# Patient Record
Sex: Male | Born: 1940 | Race: White | Hispanic: No | Smoking: Never smoker
Health system: Southern US, Community
[De-identification: ages and names within clinical notes are randomized; demographics above are authoritative.]

## PROBLEM LIST (undated history)

## (undated) DIAGNOSIS — Z8719 Personal history of other diseases of the digestive system: Secondary | ICD-10-CM

## (undated) DIAGNOSIS — Z8679 Personal history of other diseases of the circulatory system: Secondary | ICD-10-CM

## (undated) DIAGNOSIS — H269 Unspecified cataract: Secondary | ICD-10-CM

## (undated) DIAGNOSIS — M1990 Unspecified osteoarthritis, unspecified site: Secondary | ICD-10-CM

## (undated) DIAGNOSIS — K625 Hemorrhage of anus and rectum: Secondary | ICD-10-CM

## (undated) DIAGNOSIS — J309 Allergic rhinitis, unspecified: Secondary | ICD-10-CM

## (undated) DIAGNOSIS — K4090 Unilateral inguinal hernia, without obstruction or gangrene, not specified as recurrent: Secondary | ICD-10-CM

## (undated) DIAGNOSIS — K5730 Diverticulosis of large intestine without perforation or abscess without bleeding: Secondary | ICD-10-CM

## (undated) DIAGNOSIS — F32A Depression, unspecified: Secondary | ICD-10-CM

## (undated) DIAGNOSIS — K589 Irritable bowel syndrome without diarrhea: Secondary | ICD-10-CM

## (undated) DIAGNOSIS — F1010 Alcohol abuse, uncomplicated: Secondary | ICD-10-CM

## (undated) DIAGNOSIS — F411 Generalized anxiety disorder: Secondary | ICD-10-CM

## (undated) DIAGNOSIS — N400 Enlarged prostate without lower urinary tract symptoms: Secondary | ICD-10-CM

## (undated) DIAGNOSIS — I10 Essential (primary) hypertension: Secondary | ICD-10-CM

## (undated) DIAGNOSIS — D649 Anemia, unspecified: Secondary | ICD-10-CM

## (undated) DIAGNOSIS — K7460 Unspecified cirrhosis of liver: Secondary | ICD-10-CM

## (undated) DIAGNOSIS — Z87442 Personal history of urinary calculi: Secondary | ICD-10-CM

## (undated) DIAGNOSIS — K219 Gastro-esophageal reflux disease without esophagitis: Secondary | ICD-10-CM

## (undated) DIAGNOSIS — D696 Thrombocytopenia, unspecified: Secondary | ICD-10-CM

## (undated) DIAGNOSIS — F419 Anxiety disorder, unspecified: Secondary | ICD-10-CM

## (undated) DIAGNOSIS — D126 Benign neoplasm of colon, unspecified: Secondary | ICD-10-CM

## (undated) DIAGNOSIS — Z86010 Personal history of colonic polyps: Secondary | ICD-10-CM

## (undated) DIAGNOSIS — H3530 Unspecified macular degeneration: Secondary | ICD-10-CM

## (undated) DIAGNOSIS — R21 Rash and other nonspecific skin eruption: Secondary | ICD-10-CM

## (undated) DIAGNOSIS — F329 Major depressive disorder, single episode, unspecified: Secondary | ICD-10-CM

## (undated) DIAGNOSIS — R350 Frequency of micturition: Secondary | ICD-10-CM

## (undated) DIAGNOSIS — K648 Other hemorrhoids: Secondary | ICD-10-CM

## (undated) HISTORY — DX: Benign prostatic hyperplasia without lower urinary tract symptoms: N40.0

## (undated) HISTORY — DX: Alcohol abuse, uncomplicated: F10.10

## (undated) HISTORY — DX: Diverticulosis of large intestine without perforation or abscess without bleeding: K57.30

## (undated) HISTORY — DX: Frequency of micturition: R35.0

## (undated) HISTORY — PX: INGUINAL HERNIA REPAIR: SUR1180

## (undated) HISTORY — DX: Unspecified cirrhosis of liver: K74.60

## (undated) HISTORY — DX: Anxiety disorder, unspecified: F41.9

## (undated) HISTORY — DX: Personal history of urinary calculi: Z87.442

## (undated) HISTORY — DX: Irritable bowel syndrome without diarrhea: K58.9

## (undated) HISTORY — DX: Hemorrhage of anus and rectum: K62.5

## (undated) HISTORY — DX: Personal history of colonic polyps: Z86.010

## (undated) HISTORY — DX: Personal history of other diseases of the circulatory system: Z86.79

## (undated) HISTORY — PX: HERNIA REPAIR: SHX51

## (undated) HISTORY — DX: Personal history of other diseases of the digestive system: Z87.19

## (undated) HISTORY — DX: Other hemorrhoids: K64.8

## (undated) HISTORY — DX: Unspecified macular degeneration: H35.30

## (undated) HISTORY — DX: Unilateral inguinal hernia, without obstruction or gangrene, not specified as recurrent: K40.90

## (undated) HISTORY — DX: Gastro-esophageal reflux disease without esophagitis: K21.9

## (undated) HISTORY — DX: Anemia, unspecified: D64.9

## (undated) HISTORY — DX: Thrombocytopenia, unspecified: D69.6

## (undated) HISTORY — DX: Benign neoplasm of colon, unspecified: D12.6

## (undated) HISTORY — DX: Generalized anxiety disorder: F41.1

## (undated) HISTORY — DX: Major depressive disorder, single episode, unspecified: F32.9

## (undated) HISTORY — DX: Allergic rhinitis, unspecified: J30.9

## (undated) HISTORY — DX: Unspecified osteoarthritis, unspecified site: M19.90

## (undated) HISTORY — DX: Unspecified cataract: H26.9

## (undated) HISTORY — DX: Depression, unspecified: F32.A

## (undated) HISTORY — PX: CATARACT EXTRACTION: SUR2

## (undated) HISTORY — DX: Rash and other nonspecific skin eruption: R21

---

## 2001-02-28 ENCOUNTER — Encounter (INDEPENDENT_AMBULATORY_CARE_PROVIDER_SITE_OTHER): Payer: Self-pay | Admitting: Specialist

## 2001-02-28 ENCOUNTER — Other Ambulatory Visit: Admission: RE | Admit: 2001-02-28 | Discharge: 2001-02-28 | Payer: Self-pay | Admitting: Internal Medicine

## 2001-04-16 ENCOUNTER — Ambulatory Visit (HOSPITAL_COMMUNITY): Admission: RE | Admit: 2001-04-16 | Discharge: 2001-04-16 | Payer: Self-pay | Admitting: Internal Medicine

## 2001-04-16 ENCOUNTER — Encounter: Payer: Self-pay | Admitting: Internal Medicine

## 2002-01-21 ENCOUNTER — Inpatient Hospital Stay (HOSPITAL_COMMUNITY): Admission: EM | Admit: 2002-01-21 | Discharge: 2002-01-26 | Payer: Self-pay

## 2002-01-23 ENCOUNTER — Encounter: Payer: Self-pay | Admitting: Cardiology

## 2002-12-17 ENCOUNTER — Ambulatory Visit (HOSPITAL_COMMUNITY): Admission: RE | Admit: 2002-12-17 | Discharge: 2002-12-17 | Payer: Self-pay | Admitting: Orthopedic Surgery

## 2002-12-17 ENCOUNTER — Encounter: Payer: Self-pay | Admitting: Orthopedic Surgery

## 2005-08-01 ENCOUNTER — Emergency Department (HOSPITAL_COMMUNITY): Admission: EM | Admit: 2005-08-01 | Discharge: 2005-08-02 | Payer: Self-pay | Admitting: Emergency Medicine

## 2005-08-02 ENCOUNTER — Inpatient Hospital Stay (HOSPITAL_COMMUNITY): Admission: EM | Admit: 2005-08-02 | Discharge: 2005-08-05 | Payer: Self-pay | Admitting: Psychiatry

## 2005-08-02 ENCOUNTER — Ambulatory Visit: Payer: Self-pay | Admitting: Psychiatry

## 2005-09-27 ENCOUNTER — Ambulatory Visit: Payer: Self-pay | Admitting: Internal Medicine

## 2005-09-27 LAB — CONVERTED CEMR LAB
PSA: 1.51 ng/mL
PSA: 1.51 ng/mL

## 2005-10-06 ENCOUNTER — Ambulatory Visit: Payer: Self-pay

## 2005-10-11 ENCOUNTER — Ambulatory Visit: Payer: Self-pay | Admitting: Internal Medicine

## 2005-11-09 ENCOUNTER — Ambulatory Visit: Payer: Self-pay | Admitting: Cardiology

## 2005-11-15 ENCOUNTER — Ambulatory Visit: Payer: Self-pay | Admitting: Internal Medicine

## 2005-11-15 ENCOUNTER — Encounter (INDEPENDENT_AMBULATORY_CARE_PROVIDER_SITE_OTHER): Payer: Self-pay | Admitting: Specialist

## 2006-04-06 ENCOUNTER — Ambulatory Visit: Payer: Self-pay | Admitting: Internal Medicine

## 2006-04-12 ENCOUNTER — Encounter: Admission: RE | Admit: 2006-04-12 | Discharge: 2006-04-12 | Payer: Self-pay | Admitting: Otolaryngology

## 2006-06-28 ENCOUNTER — Ambulatory Visit: Payer: Self-pay | Admitting: Cardiology

## 2006-07-12 ENCOUNTER — Ambulatory Visit: Payer: Self-pay | Admitting: Internal Medicine

## 2007-03-31 ENCOUNTER — Encounter: Payer: Self-pay | Admitting: Internal Medicine

## 2007-03-31 DIAGNOSIS — Z87442 Personal history of urinary calculi: Secondary | ICD-10-CM

## 2007-03-31 DIAGNOSIS — K4090 Unilateral inguinal hernia, without obstruction or gangrene, not specified as recurrent: Secondary | ICD-10-CM

## 2007-03-31 DIAGNOSIS — F1010 Alcohol abuse, uncomplicated: Secondary | ICD-10-CM

## 2007-03-31 DIAGNOSIS — Z8679 Personal history of other diseases of the circulatory system: Secondary | ICD-10-CM

## 2007-03-31 DIAGNOSIS — F3289 Other specified depressive episodes: Secondary | ICD-10-CM

## 2007-03-31 DIAGNOSIS — F329 Major depressive disorder, single episode, unspecified: Secondary | ICD-10-CM

## 2007-03-31 DIAGNOSIS — I10 Essential (primary) hypertension: Secondary | ICD-10-CM | POA: Insufficient documentation

## 2007-03-31 DIAGNOSIS — F411 Generalized anxiety disorder: Secondary | ICD-10-CM

## 2007-03-31 HISTORY — DX: Unilateral inguinal hernia, without obstruction or gangrene, not specified as recurrent: K40.90

## 2007-03-31 HISTORY — DX: Major depressive disorder, single episode, unspecified: F32.9

## 2007-03-31 HISTORY — DX: Other specified depressive episodes: F32.89

## 2007-03-31 HISTORY — DX: Personal history of other diseases of the circulatory system: Z86.79

## 2007-03-31 HISTORY — DX: Personal history of urinary calculi: Z87.442

## 2007-03-31 HISTORY — DX: Generalized anxiety disorder: F41.1

## 2007-03-31 HISTORY — DX: Alcohol abuse, uncomplicated: F10.10

## 2007-04-04 ENCOUNTER — Ambulatory Visit: Payer: Self-pay | Admitting: Internal Medicine

## 2007-04-04 LAB — CONVERTED CEMR LAB
AST: 25 units/L (ref 0–37)
Albumin: 4 g/dL (ref 3.5–5.2)
Amylase: 100 units/L (ref 27–131)
Basophils Relative: 1.4 % — ABNORMAL HIGH (ref 0.0–1.0)
Bilirubin, Direct: 0.1 mg/dL (ref 0.0–0.3)
CO2: 34 meq/L — ABNORMAL HIGH (ref 19–32)
Chloride: 102 meq/L (ref 96–112)
Creatinine, Ser: 1 mg/dL (ref 0.4–1.5)
Eosinophils Relative: 5.9 % — ABNORMAL HIGH (ref 0.0–5.0)
Glucose, Bld: 111 mg/dL — ABNORMAL HIGH (ref 70–99)
Lymphocytes Relative: 21.8 % (ref 12.0–46.0)
MCHC: 34.5 g/dL (ref 30.0–36.0)
MCV: 91.1 fL (ref 78.0–100.0)
Monocytes Absolute: 0.4 10*3/uL (ref 0.2–0.7)
Monocytes Relative: 8.9 % (ref 3.0–11.0)
Neutrophils Relative %: 62 % (ref 43.0–77.0)
Potassium: 3.7 meq/L (ref 3.5–5.1)
RBC: 4.85 M/uL (ref 4.22–5.81)
RDW: 13.1 % (ref 11.5–14.6)
Sodium: 142 meq/L (ref 135–145)
Total Bilirubin: 0.9 mg/dL (ref 0.3–1.2)
Total Protein: 6.9 g/dL (ref 6.0–8.3)

## 2007-04-06 ENCOUNTER — Encounter: Payer: Self-pay | Admitting: Internal Medicine

## 2007-04-06 DIAGNOSIS — N400 Enlarged prostate without lower urinary tract symptoms: Secondary | ICD-10-CM

## 2007-04-06 DIAGNOSIS — D696 Thrombocytopenia, unspecified: Secondary | ICD-10-CM

## 2007-04-06 DIAGNOSIS — Z8719 Personal history of other diseases of the digestive system: Secondary | ICD-10-CM

## 2007-04-06 DIAGNOSIS — J309 Allergic rhinitis, unspecified: Secondary | ICD-10-CM

## 2007-04-06 DIAGNOSIS — K5730 Diverticulosis of large intestine without perforation or abscess without bleeding: Secondary | ICD-10-CM

## 2007-04-06 DIAGNOSIS — M1990 Unspecified osteoarthritis, unspecified site: Secondary | ICD-10-CM

## 2007-04-06 HISTORY — DX: Thrombocytopenia, unspecified: D69.6

## 2007-04-06 HISTORY — DX: Benign prostatic hyperplasia without lower urinary tract symptoms: N40.0

## 2007-04-06 HISTORY — DX: Personal history of other diseases of the digestive system: Z87.19

## 2007-04-06 HISTORY — DX: Unspecified osteoarthritis, unspecified site: M19.90

## 2007-04-06 HISTORY — DX: Diverticulosis of large intestine without perforation or abscess without bleeding: K57.30

## 2007-04-06 HISTORY — DX: Allergic rhinitis, unspecified: J30.9

## 2007-04-11 ENCOUNTER — Encounter: Admission: RE | Admit: 2007-04-11 | Discharge: 2007-04-11 | Payer: Self-pay | Admitting: Internal Medicine

## 2007-06-07 ENCOUNTER — Ambulatory Visit (HOSPITAL_COMMUNITY): Admission: RE | Admit: 2007-06-07 | Discharge: 2007-06-07 | Payer: Self-pay | Admitting: Sports Medicine

## 2007-06-25 ENCOUNTER — Ambulatory Visit: Payer: Self-pay | Admitting: Internal Medicine

## 2007-06-26 ENCOUNTER — Ambulatory Visit: Payer: Self-pay | Admitting: Internal Medicine

## 2007-06-26 ENCOUNTER — Encounter: Payer: Self-pay | Admitting: Internal Medicine

## 2007-08-02 DIAGNOSIS — K625 Hemorrhage of anus and rectum: Secondary | ICD-10-CM

## 2007-08-02 DIAGNOSIS — D126 Benign neoplasm of colon, unspecified: Secondary | ICD-10-CM

## 2007-08-02 DIAGNOSIS — K7460 Unspecified cirrhosis of liver: Secondary | ICD-10-CM

## 2007-08-02 DIAGNOSIS — K648 Other hemorrhoids: Secondary | ICD-10-CM

## 2007-08-02 HISTORY — DX: Unspecified cirrhosis of liver: K74.60

## 2007-08-02 HISTORY — DX: Other hemorrhoids: K64.8

## 2007-08-02 HISTORY — DX: Benign neoplasm of colon, unspecified: D12.6

## 2007-08-02 HISTORY — DX: Hemorrhage of anus and rectum: K62.5

## 2007-08-07 ENCOUNTER — Ambulatory Visit: Payer: Self-pay | Admitting: Internal Medicine

## 2007-08-07 DIAGNOSIS — R350 Frequency of micturition: Secondary | ICD-10-CM

## 2007-08-07 DIAGNOSIS — K219 Gastro-esophageal reflux disease without esophagitis: Secondary | ICD-10-CM

## 2007-08-07 DIAGNOSIS — Z860100 Personal history of colon polyps, unspecified: Secondary | ICD-10-CM

## 2007-08-07 DIAGNOSIS — D649 Anemia, unspecified: Secondary | ICD-10-CM

## 2007-08-07 DIAGNOSIS — K589 Irritable bowel syndrome without diarrhea: Secondary | ICD-10-CM

## 2007-08-07 DIAGNOSIS — R21 Rash and other nonspecific skin eruption: Secondary | ICD-10-CM

## 2007-08-07 HISTORY — DX: Frequency of micturition: R35.0

## 2007-08-07 HISTORY — DX: Rash and other nonspecific skin eruption: R21

## 2007-08-07 HISTORY — DX: Irritable bowel syndrome, unspecified: K58.9

## 2007-08-07 HISTORY — DX: Personal history of colon polyps, unspecified: Z86.0100

## 2007-08-07 HISTORY — DX: Anemia, unspecified: D64.9

## 2007-08-07 HISTORY — DX: Personal history of colonic polyps: Z86.010

## 2007-08-07 HISTORY — DX: Gastro-esophageal reflux disease without esophagitis: K21.9

## 2007-08-10 ENCOUNTER — Encounter: Payer: Self-pay | Admitting: Internal Medicine

## 2007-08-10 LAB — CONVERTED CEMR LAB
Bacteria, UA: NEGATIVE
Crystals: NEGATIVE
Hemoglobin, Urine: NEGATIVE
Specific Gravity, Urine: 1.01 (ref 1.000–1.03)
Squamous Epithelial / LPF: NEGATIVE /lpf
Total Protein, Urine: NEGATIVE mg/dL
pH: 6 (ref 5.0–8.0)

## 2007-08-17 ENCOUNTER — Ambulatory Visit: Payer: Self-pay | Admitting: Cardiology

## 2007-08-22 ENCOUNTER — Ambulatory Visit: Payer: Self-pay | Admitting: Internal Medicine

## 2007-11-13 ENCOUNTER — Encounter: Payer: Self-pay | Admitting: Internal Medicine

## 2007-12-20 ENCOUNTER — Ambulatory Visit (HOSPITAL_COMMUNITY): Admission: RE | Admit: 2007-12-20 | Discharge: 2007-12-20 | Payer: Self-pay | Admitting: Internal Medicine

## 2008-06-26 ENCOUNTER — Ambulatory Visit (HOSPITAL_COMMUNITY): Admission: RE | Admit: 2008-06-26 | Discharge: 2008-06-26 | Payer: Self-pay | Admitting: Internal Medicine

## 2008-07-01 IMAGING — US US ABDOMEN COMPLETE
1 series · 14 of 25 positions shown · non-contrast
Comparison: none

CLINICAL DATA: Right upper quadrant pain. 
 COMPLETE ABDOMINAL ULTRASOUND:
TECHNIQUE: Complete abdominal ultrasound examination was performed including evaluation of the liver, gallbladder, bile ducts, pancreas, kidneys, spleen, IVC, and abdominal aorta.

[Series 1: us abdomen complete · 0.32mm/px · 14 of 55 slices shown]
[im 1/55]
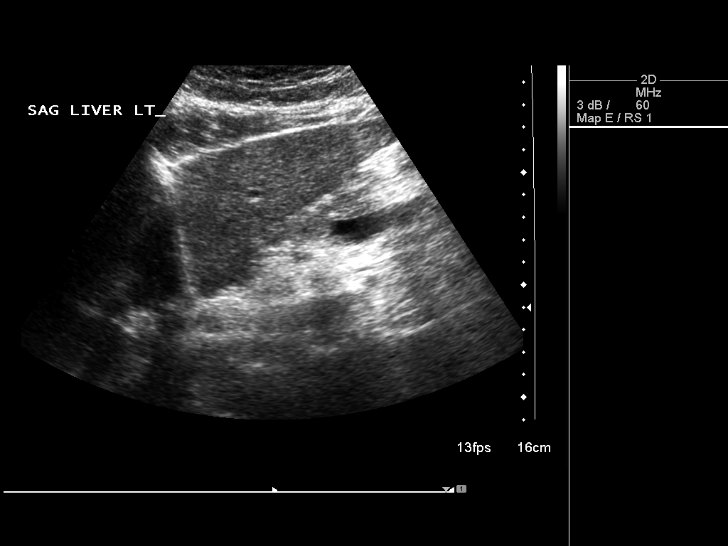
[im 5/55]
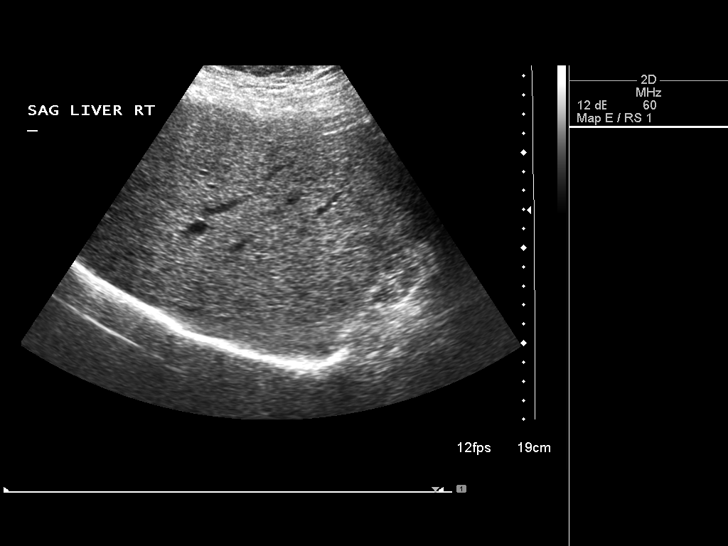
[im 10/55]
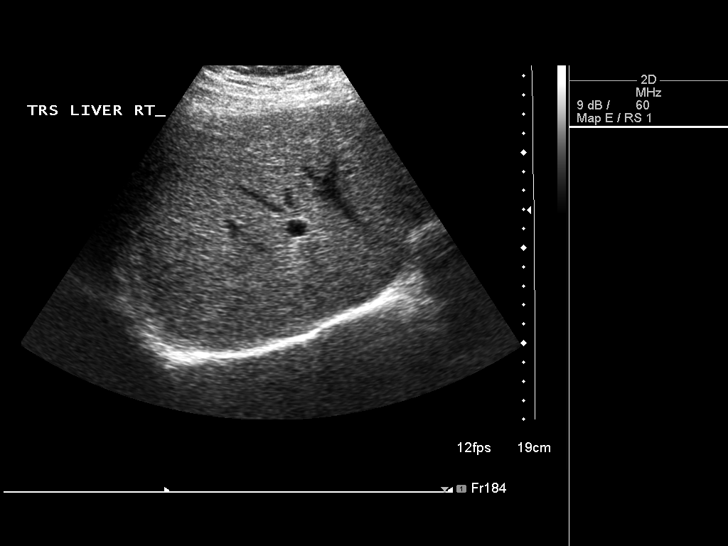
[im 14/55]
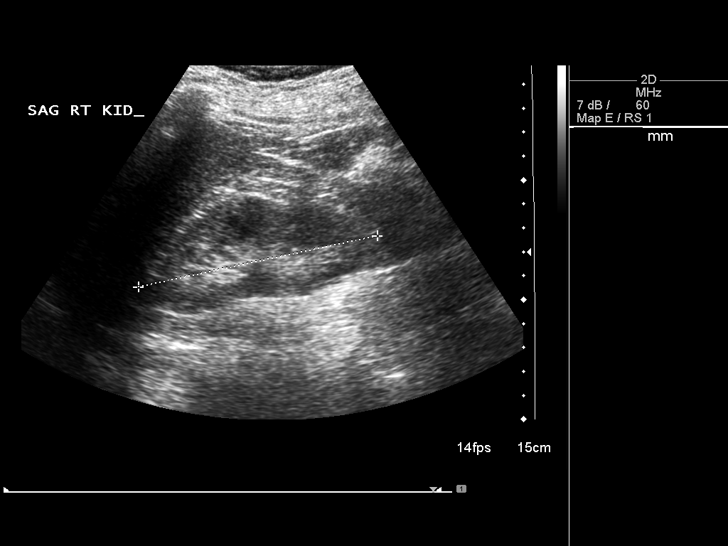
[im 19/55]
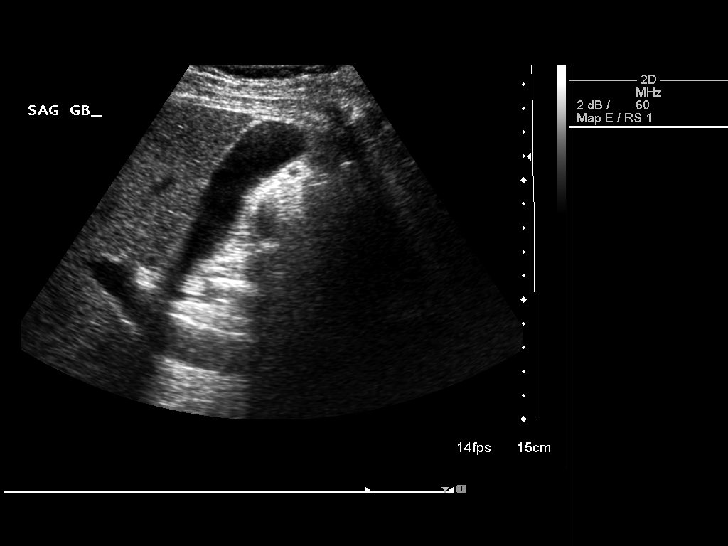
[im 21/55]
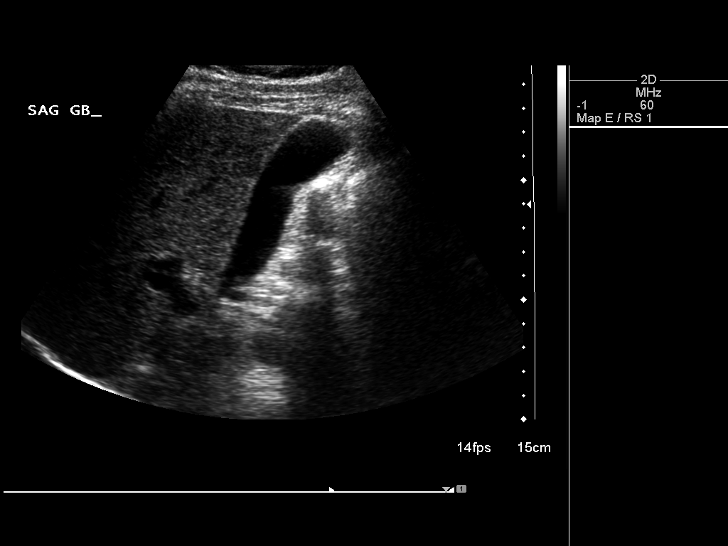
[im 25/55]
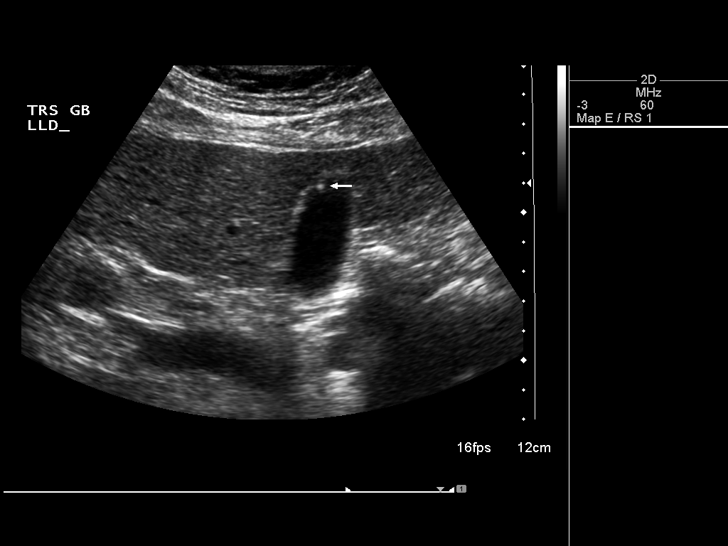
[im 30/55]
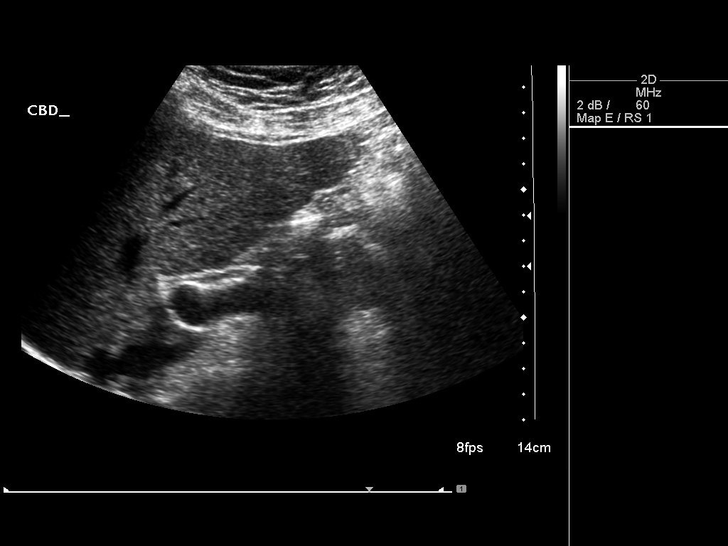
[im 34/55]
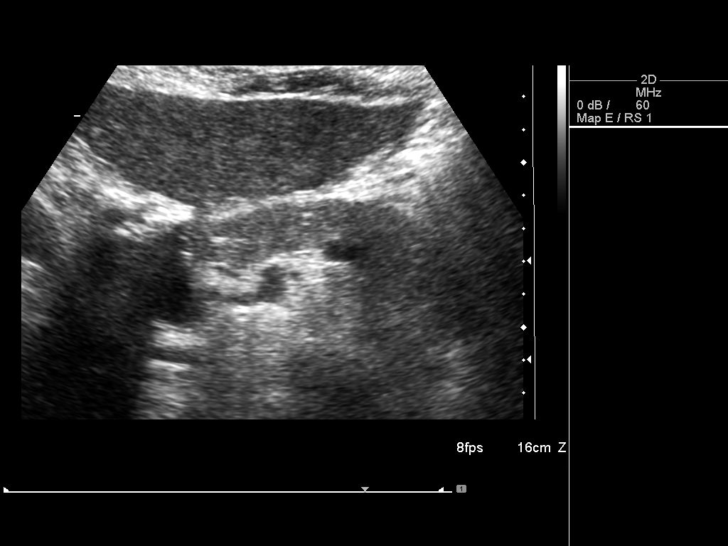
[im 37/55]
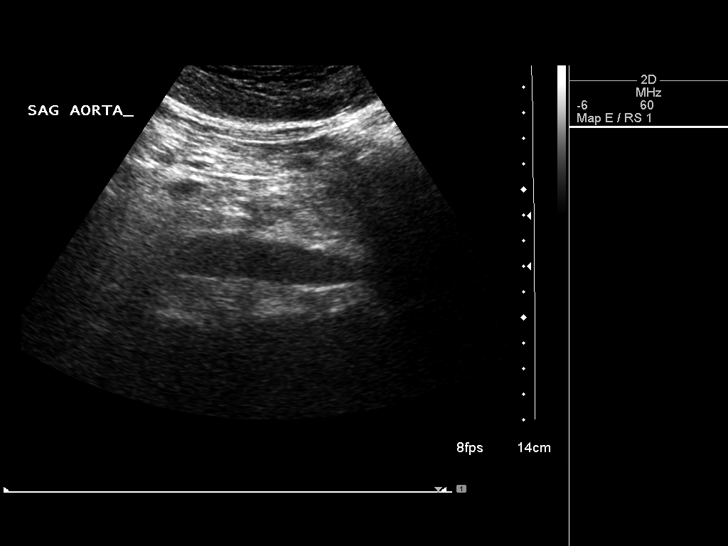
[im 41/55]
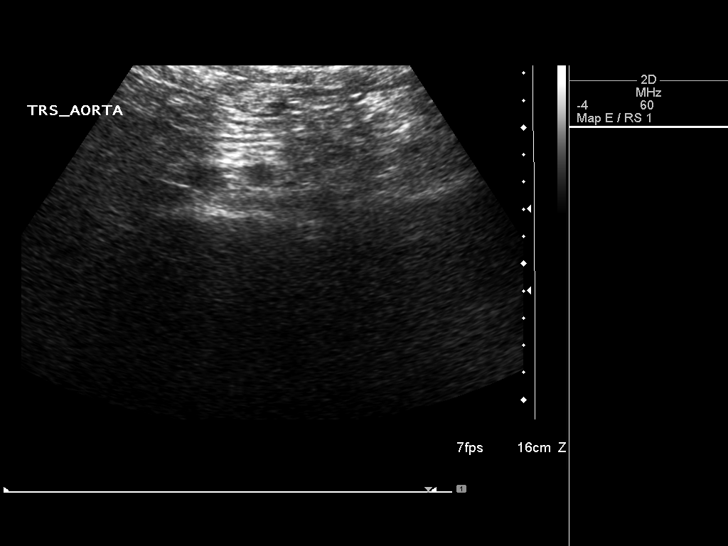
[im 46/55]
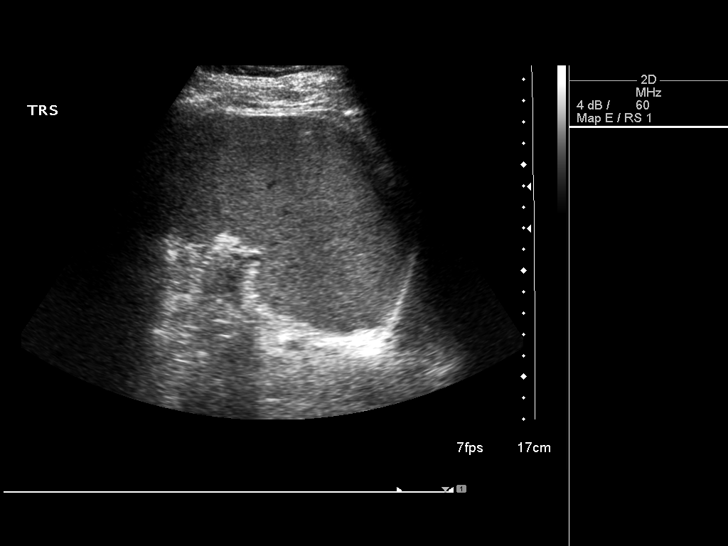
[im 50/55]
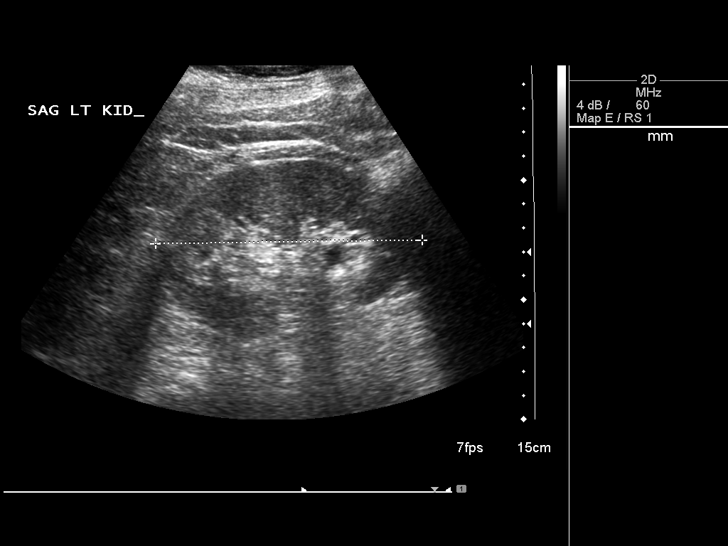
[im 55/55]
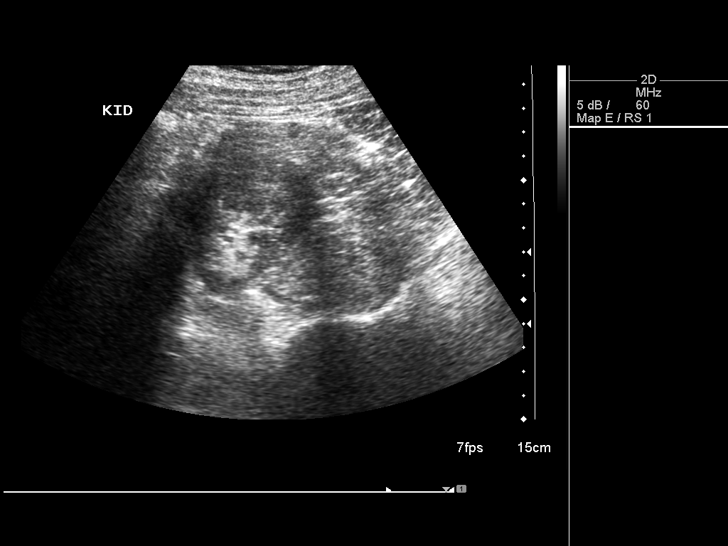

[14 of 25 positions shown; findings below may reference images not displayed]

FINDINGS: No gallstones are seen.  A small gallbladder polyp is noted of 4mm.  The liver has a normal echogenic pattern.  The common bile duct is normal measuring 6mm.  The IVC, pancreas, and spleen appear normal.  No hydronephrosis is seen.  The right kidney measures 10.2cm sagittally with the left kidney measuring 11.2cm.  The abdominal aorta is normal in caliber.
IMPRESSION: No gallstones.  4mm gallbladder polyp.  No ductal dilatation.

## 2008-10-15 ENCOUNTER — Encounter: Payer: Self-pay | Admitting: Cardiology

## 2008-10-15 ENCOUNTER — Ambulatory Visit: Payer: Self-pay | Admitting: Cardiology

## 2008-10-19 ENCOUNTER — Emergency Department (HOSPITAL_COMMUNITY): Admission: EM | Admit: 2008-10-19 | Discharge: 2008-10-19 | Payer: Self-pay | Admitting: Emergency Medicine

## 2010-01-18 ENCOUNTER — Encounter: Payer: Self-pay | Admitting: Cardiology

## 2010-01-19 ENCOUNTER — Ambulatory Visit: Payer: Self-pay | Admitting: Cardiology

## 2010-08-17 NOTE — Miscellaneous (Signed)
  Clinical Lists Changes  Observations: Added new observation of PAST MED HX: Anxiety Depression Hypertension Chronic Alcoholism Allergic rhinitis Diverticulosis, colon Osteoarthritis Benign prostatic hypertrophy Thrombocytopenia Hx of Alcoholic Hepatitis GERD Atrial fibrillation...paroxysmal...rare Colonic polyps, hx of Nephrolithiasis, hx of chronic sinusitis with nasal polyp Anemia-NOS thrombocytopenia secondary to ETOH hx IBS Liver disease....alcohol...Marland Kitchenno varices EF  55%...echo...2003 Nuclear scan in past...Marland KitchenMarland Kitchen?  slight inferior ischemia....no cath (01/18/2010 13:47) Added new observation of PRIMARY MD: Carylon Perches, MD (01/18/2010 13:47)       Past History:  Past Medical History: Anxiety Depression Hypertension Chronic Alcoholism Allergic rhinitis Diverticulosis, colon Osteoarthritis Benign prostatic hypertrophy Thrombocytopenia Hx of Alcoholic Hepatitis GERD Atrial fibrillation...paroxysmal...rare Colonic polyps, hx of Nephrolithiasis, hx of chronic sinusitis with nasal polyp Anemia-NOS thrombocytopenia secondary to ETOH hx IBS Liver disease....alcohol...Marland Kitchenno varices EF  55%...echo...2003 Nuclear scan in past...Marland KitchenMarland Kitchen?  slight inferior ischemia....no cath

## 2010-08-17 NOTE — Assessment & Plan Note (Signed)
Summary: f1y/ gd   Visit Type:  Follow-up Primary Provider:  Carylon Perches, MD  CC:  palpitations.  History of Present Illness: The patient is seen for followup history of palpitations.  He is actually done quite well.  He had atrial fibrillation in the remote past.  He is having very few symptoms.  I doubt any significant arrhythmias.  He does not have any significant chest pain.  Current Medications (verified): 1)  Flomax 0.4 Mg Cp24 (Tamsulosin Hcl) .... Take 1 Tablet By Mouth Once A Day 2)  Lexapro 20 Mg Tabs (Escitalopram Oxalate) .... Take 1 Tablet By Mouth Once A Day 3)  Protonix 40 Mg Tbec (Pantoprazole Sodium) .... Take 1 Tablet By Mouth Once A Day 4)  Zolpidem Tartrate 10 Mg Tabs (Zolpidem Tartrate) .... Take 1 Tablet By Mouth At Bedtime Prn 5)  Micardis Hct 40-12.5 Mg  Tabs (Telmisartan-Hctz) .Marland Kitchen.. 1 By Mouth Qd 6)  Aspirin 81 Mg  Tabs (Aspirin) .Marland Kitchen.. 1 Once Daily 7)  Milk Thistle 175 Mg Caps (Milk Thistle) .... 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  slight inferior ischemia....no cath  Review of Systems       Patient denies fever, chills, headache, sweats, rash, change in vision, change in hearing, chest pain, cough, nausea vomiting, urinary symptoms.  All of the systems are reviewed and are negative.  Vital Signs:  Patient profile:   70 year old male Height:      68 inches Weight:      167 pounds BMI:     25.48 Pulse rate:   62 /  minute BP sitting:   142 / 68  (left arm) Cuff size:   regular  Vitals Entered By: Hardin Negus, RMA (January 19, 2010 9:03 AM)  Physical Exam  General:  patient is quite stable. Eyes:  no xanthelasma. Neck:  no jugular venous distention. Lungs:  lungs are clear.  Respiratory effort is not labored. Heart:  cardiac exam reveals S1-S2.  No clicks or significant murmurs. Abdomen:  abdomen is soft. Extremities:  no peripheral edema. Psych:  patient is oriented to person time or place.  Affect is normal.   Impression & Recommendations:  Problem # 1:  ATRIAL FIBRILLATION (ICD-427.31)  His updated medication list for this problem includes:    Aspirin 81 Mg Tabs (Aspirin) .Marland Kitchen... 1 once daily  Orders: EKG w/ Interpretation (93000) There has been no return of significant atrial fibrillation.  EKG is done today and reviewed by me.  There is normal sinus rhythm.  Problem # 2:  HYPERTENSION (ICD-401.9)  His updated medication list for this problem includes:    Micardis Hct 40-12.5 Mg Tabs (Telmisartan-hctz) .Marland Kitchen... 1 by mouth qd    Aspirin 81 Mg Tabs (Aspirin) .Marland Kitchen... 1 once daily Blood pressure is controlled today.  No change in therapy.  Patient Instructions: 1)  Your physician wants you to follow-up in:  2 years.  You will receive a reminder letter in the mail two months in advance. If you don't receive a letter, please call our office to schedule the follow-up appointment.

## 2010-10-27 LAB — LIPASE, BLOOD: Lipase: 20 U/L (ref 11–59)

## 2010-10-27 LAB — URINALYSIS, ROUTINE W REFLEX MICROSCOPIC
Glucose, UA: NEGATIVE mg/dL
Ketones, ur: 40 mg/dL — AB
Nitrite: NEGATIVE
Protein, ur: NEGATIVE mg/dL

## 2010-10-27 LAB — COMPREHENSIVE METABOLIC PANEL
ALT: 44 U/L (ref 0–53)
Alkaline Phosphatase: 63 U/L (ref 39–117)
CO2: 15 mEq/L — ABNORMAL LOW (ref 19–32)
GFR calc non Af Amer: >60 mL/min (ref >60)
Glucose, Bld: 167 mg/dL — ABNORMAL HIGH (ref 70–99)
Potassium: 3.7 mEq/L (ref 3.5–5.1)
Sodium: 142 mEq/L (ref 135–145)
Total Bilirubin: 1.2 mg/dL (ref 0.3–1.2)

## 2010-10-27 LAB — DIFFERENTIAL
Basophils Relative: 0 % (ref 0–1)
Eosinophils Absolute: 0.1 10*3/uL (ref 0.0–0.7)
Neutrophils Relative %: 94 % — ABNORMAL HIGH (ref 43–77)

## 2010-10-27 LAB — BASIC METABOLIC PANEL
BUN: 13 mg/dL (ref 6–23)
Chloride: 110 mEq/L (ref 96–112)
Creatinine, Ser: 0.97 mg/dL (ref 0.4–1.5)

## 2010-10-27 LAB — CBC
Hemoglobin: 17.5 g/dL — ABNORMAL HIGH (ref 13.0–17.0)
RBC: 5.26 MIL/uL (ref 4.22–5.81)

## 2010-11-30 NOTE — Assessment & Plan Note (Signed)
[facility]                            CARDIOLOGY OFFICE NOTE   NAME:Gamino, PANCHO RUSHING                         MRN:          [id number]  DATE:10/15/2008                            DOB:          October 14, 1940    Mr. Bene is seen for followup.  He is not having any significant chest  pain.  There was question that he might need surgery on his ankle and he  needed cardiac clearance for this.  Since that time, his ankle has  improved.  Also since that time, his CT scan shows that he does have  some cirrhosis.   PAST MEDICAL HISTORY:   ALLERGIES:  No known drug allergies.   MEDICATIONS:  Flomax, Lexapro, Protonix, Zolpidem, Micardis, and  aspirin.   OTHER MEDICAL PROBLEMS:  See the complete list on my note of August 17, 2007.   REVIEW OF SYSTEMS:  He has no GI or GU symptoms.  He does have mild  abdominal pain at times when he gets upset.  He has no fevers or chills.  There are no skin rashes.  Otherwise, his review of systems is negative.   PHYSICAL EXAMINATION:  VITAL SIGNS:  Blood pressure is 152/82 with a  pulse of 64.  GENERAL:  The patient is oriented to person, time, and place.  Affect is  normal.  HEENT:  No xanthelasma.  He has normal extraocular motion.  NECK:  There are no carotid bruits.  There are no jugular venous  distention.  LUNGS:  Clear.  Respiratory effort is not labored.  CARDIAC:  An S1 with an S2.  There are no clicks or significant murmurs.  EXTREMITIES:  He has no peripheral edema.   EKG today is normal.   Problems are listed on my prior note.  He has a history of a  questionable abnormality on a Myoview scan in the past.  We do not know  if he has coronary disease.  However, he is stable at this point and no  further workup is needed.  I will see him back in 1 year or if needed.     Luis Abed, MD, [facility]  Electronically Signed    JDK/MedQ  DD: 10/15/2008  DT: 10/16/2008  Job #: [id number]   cc:   Kingsley Callander. Ouida Sills, MD

## 2010-11-30 NOTE — Assessment & Plan Note (Signed)
[facility]                         GASTROENTEROLOGY OFFICE NOTE   NAME:Calarco, ELAN BRAINERD                         MRN:          [id number]  DATE:06/25/2007                            DOB:          04-21-1941    OFFICE CONSULTATION:   REASON FOR CONSULTATION:  Abdominal pain.   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  He was last seen in the  office October 11, 2005, for rectal bleeding and surveillance colonoscopy.  Adenomatous colon polyps were initially diagnosed in 2002.  Surveillance  colonoscopy in May 2007 was remarkable for a diminutive polyp only.  Also diverticulosis.  Follow-up in 5 years recommended.  The patient is  referred today regarding two types of abdominal discomfort.  First, he  reports having left upper quadrant discomfort which he describes as  spasm.  He has had this for 20-25 years.  It has actually been somewhat  better over the past 2 years since he has not been using alcohol.  He  does note the discomfort is occasionally worse with meals and  occasionally worse with tension.  Discomfort can occur once or twice per  week and generally lasts for seconds.  It does not last for minutes.  He  also feels that discomfort can be improved with passing of flatus.  He  generally has 3-5 bowel movements per day, which he has had for years.  This is normal for him.  No bleeding.  His weight has gone up a bit over  the past few years.  He was placed on a proton pump inhibitor, which he  does not feel helps these particular symptoms.  He has a remote history  of reflux-type symptoms, which he has not had for years.  No dysphagia.  The second type of discomfort he describes is under the rib cage on the  right.  He notices this when he is lying on his  left side.  However, if  he moves a certain way the pains resolve.   PAST MEDICAL HISTORY:  As above.   ALLERGIES:  No known drug allergies.   CURRENT MEDICATIONS:  1. Diovan/HCT 60/25 mg daily.  2. Lexapro 10 mg daily.  3. Aspirin 81 mg daily.  4. Multivitamin.  5. Campra 333 mg t.i.d.  6. Flomax 0.4 mg daily.  7. Ambien p.r.n.  8. Protonix 40 mg daily.   PHYSICAL EXAM:  A well-appearing male in no acute distress.  He is alert  and oriented.  Blood pressure is 122/80, heart rate 66 and regular, weight is 179.8  pounds.  HEENT:  Sclerae are anicteric.  Conjunctivae are pink.  Oral mucosa is  intact.  No adenopathy.  LUNGS:  Clear.  HEART:  Regular.  ABDOMEN:  Soft without tenderness, mass or hernia.  Good bowel sounds  heard.  EXTREMITIES:  Without edema.   IMPRESSION:  1. Chronic left upper quadrant pain.  This certainly sounds like spasm  or trapped gas in the region of the splenic flexure.  No alarm      features.  2. Right upper quadrant pain, most certainly musculoskeletal.  3. History of alcoholic liver disease.  4. History of adenomatous colon polyps.  5. General medical problems as outlined.   RECOMMENDATIONS:  1. Upper endoscopy to evaluate abdominal pain as well as screen for      esophageal varies.  The nature of the procedure as well as the      risks, benefits, and alternatives have been reviewed.  He      understood and agreed to proceed.  2. Continue abstinence from alcohol.  3. Consider antispasmodic if pain is troublesome to the patient.  4. Ongoing general medical care with Dr. Jonny Ruiz.     Wilhemina Bonito. Marina Goodell, MD  Electronically Signed    JNP/MedQ  DD: 06/25/2007  DT: 06/25/2007  Job #: [id number]   cc:   Corwin Levins, MD

## 2010-11-30 NOTE — Assessment & Plan Note (Signed)
[facility]                            CARDIOLOGY OFFICE NOTE   NAME:Shane Ochoa                         MRN:          [id number]  DATE:08/17/2007                            DOB:          06/29/1941    [name] is seen for cardiology follow-up.  His cardiac status is  stable.  An exercise test in the past has questioned mild inferior  ischemia.  We watched this clinically and he has been stable.  He goes  about full activities and does not have significant symptoms.  He  continues to have some discomfort in his abdomen.  Etiology is not  clear.  He tells me that he had an endoscopy that was read as normal.  He did not have varices.  He tells me he was taken off his reflux  medication and within a few days he had symptoms and he restarted this.  I think it is felt that his abdominal pain is some type of spasm that is  musculoskeletal.  He is quite active physically.   PAST MEDICAL HISTORY:   ALLERGIES:  No known drug allergies.   MEDICATIONS:  Lexapro, aspirin, multivitamin, Campral, Flomax, Ambien,  Protonix and Micardis hydrochlorothiazide.   OTHER:  Medical problems see the list below.   REVIEW OF SYSTEMS:  Other than the HPI his review of systems is  negative.   PHYSICAL EXAM:  Weight physical exam, weight is 180 pounds.  Blood  pressure is 130/76 with a pulse of 59.  The patient is oriented to person, time and place.  Affect is normal.  HEENT:  Reveals no xanthelasma.  He has normal extraocular motion.  There are no carotid bruits.  There is no jugular venous distention.  Lungs are clear.  Respiratory effort is not labored.  Cardiac exam reveals S1-S2.  There are no clicks or significant murmurs.  Abdomen is soft.  There are normal bowel sounds.  No peripheral edema.   EKG reveals sinus bradycardia.   PROBLEMS:  1. Chronic left upper quadrant abdominal pain.  This may be trapped      gas or muscle spasm.  He is stable.  2. Right  upper quadrant abdominal pain that appears to be      musculoskeletal.  3. Next history of alcoholic liver disease but no varices.  4. Next history of adenomatous polyps.  5. Next history of significant alcohol use in the past although he has      been dry for it would appear +2 years.  6. Next episode of paroxysmal atrial fibrillation many years ago.  7. Next history of hypertension treated.  8. Next BPH.  9. History of a low white count and low platelet count by history.      This seems somewhat improved by his recent labs.  10.History in the past of alcoholic hepatitis.  11.Next history of depression and anxiety stable.  12.History of renal stones.  13.Next history of Myoview scan with question of inferior ischemia but      he is quite stable.  The cardiac viewpoint I would  continue      aspirin.  We really do not know if he has coronary disease.      Aggressive primary prevention would be appropriate with no smoking      and with exercise.  With his overall liver status he could probably      tolerate a statin, but I am not pushing for that at this time.   See back in 1 year for follow-up.     Luis Abed, MD, [facility]  Electronically Signed    JDK/MedQ  DD: 08/17/2007  DT: 08/17/2007  Job #: [id number]   cc:   Corwin Levins, MD

## 2010-11-30 NOTE — Assessment & Plan Note (Signed)
[facility]                         GASTROENTEROLOGY OFFICE NOTE   NAME:Shane Ochoa, Shane Ochoa                         MRN:          [id number]  DATE:08/22/2007                            DOB:          1941/02/24    HISTORY:  This is a 70 year old white male with a history of alcoholic  cirrhosis of the liver with chronic thrombocytopenia, hypertension,  atrial fibrillation, osteoarthritis, anxiety with depression, and benign  prostatic hypertrophy.  He also has a history of adenomatous colon  polyps.  He was evaluated in the office June 25, 2007 for chronic  left upper quadrant pain, which was felt to be spasm or trapped gas, and  right upper quadrant pain, which was felt to be musculoskeletal.  He did  undergo upper endoscopy June 26, 2007.  This was normal.  No evidence  of esophageal varices.  He was told to stop his proton pump inhibitor  and return to care of his primary care Blake Goya, Dr. Jonny Ruiz.  He saw Dr.  Jonny Ruiz August 07, 2007.  At that time, he was found to have a symptomatic  perianal rash, which was treated.  The patient states that improved.  Tells me he is here today because he received a call stating that he  should have an appointment.  There is some question as to whether he has  hemorrhoids.  The patient denies any complaints at this time, except for  some back pain, which he states was induced due to some heavy lifting.   MEDICATIONS:  Lexapro.  Aspirin.  Multivitamin.  Flomax.  Ambien.  Protonix.  Micardis.   PHYSICAL EXAM:  Well-appearing male in no acute distress.  Blood pressure 120/56, heart rate 68, weight 182.4 pounds.  HEENT:  Sclerae anicteric.  LUNGS:  Clear.  HEART:  Regular.  ABDOMEN:  Soft without tenderness, mass, or hernia.  RECTAL:  No significant hemorrhoids.  Does have a rash in the crack of  his buttocks.  It is erythematous.  EXTREMITIES:  Without edema.   IMPRESSION:  1. Alcoholic liver disease.  Currently  well compensated.  2. History of adenomatous colon polyps.  Surveillance up to date.  3. Chronic abdominal pain without significant cause.  4. Buttock rash improving with cream provided by Dr. Jonny Ruiz.   RECOMMENDATIONS:  1. Discontinue Protonix as previously suggested.  2. Continue to abstain from alcohol.  3. Followup colonoscopy planned for 2012.  4. Resume general medical care with Dr. Jonny Ruiz.  GI followup p.r.n.     Wilhemina Bonito. Marina Goodell, MD  Electronically Signed    JNP/MedQ  DD: 08/22/2007  DT: 08/23/2007  Job #: [id number]   cc:   Corwin Levins, MD

## 2010-12-03 NOTE — Assessment & Plan Note (Signed)
[facility]                            CARDIOLOGY OFFICE NOTE   NAME:Shane Ochoa, Shane Ochoa                         MRN:          [id number]  DATE:06/28/2006                            DOB:          [birth year]    Shane Ochoa is seen for cardiology followup.  I believe that he is stable  at this time.  I saw him in April of 2007.  He had a nuclear exercise  test that I had read.  There was a question of mild ischemia at the base  and mid inferior wall.  There was no chest pain.  I saw him in April and  we have followed him.  Since then, he has been stable.  I have been  hesitant to proceed with catheterization, as his platelet count has been  low.  He does have a problem with chronic alcohol intake.  He continues  to have some episodes of a sensation of a knot in his left  epigastrium.  It is not related to exercise.  He has not had any  significant alcohol in 10 months.  This does not sound like ischemia.   PAST MEDICAL HISTORY:   ALLERGIES:  No known drug allergies.   MEDICATIONS:  He is on Diovan, hydrochlorothiazide, Ambien, Lexapro 10,  Flomax 0.4, __________ 333 mg 2 tabs t.i.d., and Protonix, and aspirin  81.   OTHER MEDICAL PROBLEMS:  See the list below.   REVIEW OF SYSTEMS:  He is really quite active and he is doing well.  His  only complaint is what is mentioned in the HPI.  Otherwise, review of  systems is negative.   PHYSICAL EXAM:  His weight is 175 pounds.  He has gained weight since  April of 2007.  His blood pressure is 114/71 with a pulse of 62.  The patient is oriented to person, time, and place, and his affect is  normal.  HEENT:  No xanthelasma.  He has normal extraocular motion.  There are no carotid bruits.  There is no jugular venous distension.  LUNGS:  Clear.  Respiratory effort is not labored.  CARDIAC:  Reveals an S1 with an S2.  There are no clicks or significant  murmurs.  ABDOMEN:  Soft.  I feel no masses or bruits.  He has  no significant  peripheral edema.   EKG is normal.   PROBLEMS:  1. History of significant alcohol use over time, but he has been dry      for at least 10 months.  2. Episode of paroxysmal atrial fibrillation many years ago.  3. History of hypertension, treated.  4. Benign prostatic hypertrophy.  5. Low white count and low platelet count by history.  I do not see      recent labs.  6. History of alcoholic hepatitis.  7. History of depression and anxiety, stable.  8. History of renal stones.  9. Myoview scan raising the question of some inferior ischemia, which      we are following medically.  It would probably be optimal to push  for medications for possible elevated cholesterol.  I am hesitant      to do this with his history of liver abnormalities.  I am deferring      that at this time.  He has had alcoholic hepatitis in the past.  He      does not have proven coronary artery disease at this time.   We will not proceed with catheterization.  He is to be watched  medically.  I will be happy to see him for cardiology followup.     Luis Abed, MD, [facility]  Electronically Signed    JDK/MedQ  DD: 06/28/2006  DT: 06/28/2006  Job #: [id number]

## 2010-12-03 NOTE — Discharge Summary (Signed)
NAME:  Shane Ochoa, Shane Ochoa                  ACCOUNT NO.:  [id number]   MEDICAL RECORD NO.:  [id number]          PATIENT TYPE:  IPS   LOCATION:  0304                          FACILITY:  [facility]   PHYSICIAN:  Geoffery Lyons, M.D.      DATE OF BIRTH:  January 25, 1941   DATE OF ADMISSION:  08/02/2005  DATE OF DISCHARGE:  08/05/2005                                 DISCHARGE SUMMARY   CHIEF COMPLAINT AND PRESENT ILLNESS:  This was the first admission to [facility] for this 70 year old married white male voluntarily  admitted.  History of alcohol dependence, drinking over one-half gallon of  vodka daily.  Relapsed two years ago.  Drinks at home, starting in the  morning.  History of withdrawal with DTs.  Recently fell, hit the left ear.  Did admit to symptoms of depression.  Grieving over parents' death.   PAST PSYCHIATRIC HISTORY:  Detoxed in 12-04-02.   ALCOHOL/DRUG HISTORY:  Persistent use of alcohol.  History of DTs in Dec 04, 2002.  No seizures.  Recently relapsed two years ago.  No other substances.   MEDICAL HISTORY:  Arterial hypertension.   MEDICATIONS:  Diovan 160/25 mg, 1 daily.   PHYSICAL EXAMINATION:  Performed and failed to show any acute findings.   LABORATORY DATA:  White blood cells 2.1, hemoglobin 13.9.  Liver enzymes  with SGOT 165, SGPT 45, total bilirubin 1.8, __________ 25, TSH 2.147.   MENTAL STATUS EXAM:  Alert, cooperative male.  Fair eye contact.  Somewhat  disheveled, tremulous.  Speech clear, normal rate, tempo and production.  Mood anxious.  Affect anxious.  Thought processes are logical, coherent and  relevant.  Wanted some help.  Wanted to be detoxed.  No delusions.  No  suicidal or homicidal ideation.  No hallucinations.  Cognition was well-  preserved.   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  Alcohol dependence.  AXIS II:  No diagnosis.  AXIS III:  Arterial hypertension.  AXIS IV:  Moderate.  AXIS V:  GAF upon  admission 35; highest GAF in the last year 60.   HOSPITAL COURSE:  He was admitted.  He was started in individual and group  psychotherapy.  He was detoxified with Librium.  He had been on citalopram  that apparently he was not taking.  He did endorse that he relapsed in 2002-12-04  after father died.  He was abstinent for four years.  Increased use, unable  to stop.  Endorsed depression, persistent grief, very tearful, depressed,  shaky with sweats.  By August 03, 2005, detoxification was going  uneventfully.  Withdrawal was under better control, less labile affect,  still isolating, staying in his bed, not going out to interact.  Continued  to address the grief, the loss of his father.  Still continued to be shaky,  some tremors.  Willing to pursue the Lexapro as an antidepressant.  Family  session with the wife and the daughter was held by the Child psychotherapist as well  as the physician.  His need to abstain was  addressed.  We pointed out  towards the need to have a relapse prevention plan and his commitment to  abstinence.   DISCHARGE DIAGNOSES:  AXIS I:  Alcohol dependence.  Depressive disorder not  otherwise specified.  AXIS II:  No diagnosis.  AXIS III:  Arterial hypertension.  AXIS IV:  Moderate.  AXIS V:  GAF upon discharge 50-55.   DISCHARGE MEDICATIONS:  1.  Lexapro 10 mg per day.  2.  Campral 333 mg, 2 three times a day.  3.  Ambien 10 mg, 1-1/2 to 1 at bedtime for sleep.  4.  Diovan/hydrochlorothiazide 160/25 mg, 1 daily.   FOLLOW UP:  [facility] and encouraged to attend daily AA meetings.      Geoffery Lyons, M.D.  Electronically Signed     IL/MEDQ  D:  08/15/2005  T:  08/16/2005  Job:  [id number]

## 2010-12-03 NOTE — H&P (Signed)
[place]. [facility]  Patient:    Shane Ochoa, Shane Ochoa Visit Number: [id number] MRN: [id number]          Service Type: MED Location: [phone] Attending Physician:  Nelta Numbers Dictated by:   Gerrit Friends. Dietrich Pates, M.D. [facility] Admit Date:  01/21/2002                           History and Physical  PRIMARY CARE PHYSICIAN:  Corwin Levins, M.D.  HISTORY OF PRESENT ILLNESS:  A 70 year old gentleman with remote episode of paroxysmal atrial fibrillation, presents with a recurrence.  Mr. _____ noted the onset of palpitations and dizziness this morning.  He subsequently felt nauseated and had three episodes of emesis.  He came to the emergency room for continued weakness, where atrial fibrillation with a rapid ventricular rate was identified.  He has had initial dose of diltiazem with only modest slowing of his heart rate.  The patient reports a similar episode approximately 20-25 years ago.  He was seen by a [place] cardiologist at that time and underwent a stress test, apparently with negative results.  He converted spontaneously within the first 24 hours during that first episode.  MEDICATIONS:  HCTZ 25 mg q.d., Diovan 160 mg q.d., doxycycline 100 mg q.d., aspirin 325 mg q.d.  PAST MEDICAL HISTORY:  Hypertension has been well-controlled.  Otherwise generally healthy.  Has had some osteoarthritis and BPH.  Reports seasonal allergic rhinitis.  Has rosacea.  Suffered an episode of heat stroke one year ago.  PAST SURGICAL HISTORY:  Hernia repair and surgery to his right face.  SOCIAL HISTORY:  He lives in [place] with his wife.  Works as a Visual merchandiser. No tobacco or alcohol.  FAMILY HISTORY:  Fairly vague.  No significant coronary disease.  REVIEW OF SYSTEMS:  Occasional arthralgias.  Frequent bowel movements without frank diarrhea.  All other systems negative.  PHYSICAL EXAMINATION:  VITAL SIGNS:  The heart rate is 150 and irregular, respirations  18, blood pressure 100/65.  GENERAL:  A pleasant, thin gentleman in no acute distress.  HEENT:  Anicteric sclerae.  NECK:  No jugular venous distention, no bruits.  CHEST:  Lungs clear.  CARDIAC:  Normal first and second heart sounds.  ABDOMEN:  Soft and nontender.  No organomegaly.  ENDOCRINE:  No thyromegaly.  HEMATOPOIETIC:  No adenopathy.  SKIN:  Rosacea.  EXTREMITIES:  No edema, 1-2+ distal pulses.  NEUROMUSCULAR:  Symmetric strength and tone.  LABORATORY DATA:  EKG:  Atrial fibrillation with a rapid ventricular response and delayed R-wave progression, minor nonspecific ST segment abnormality.  IMPRESSION:  Mr. Petrak has recurrent atrial fibrillation after an astounding interval between events of approximately 20 years.  His rate at presentation was relatively fast, and he has been relatively resistant to initial treatment with IV diltiazem.  Metoprolol will be started with an initial dose of 50 mg and then 25 mg q.i.d.  His intravenous infusion of diltiazem will be increased.  Once heart rate is controlled, consideration will be given to a single dose of flecainide to effect cardioversion.  Heparin will be administered for the time being, but full anticoagulation will probably not be necessary in the long term.  An echocardiogram and TSH are pending. Dictated by:   Gerrit Friends. Dietrich Pates, M.D. [facility] Attending Physician:  Nelta Numbers DD:  01/21/02 TD:  01/23/02 Job: [id number]

## 2010-12-03 NOTE — Discharge Summary (Signed)
[place]. [facility]  Patient:    Shane Ochoa, Shane Ochoa Visit Number: [id number] MRN: [id number]          Service Type: MED Location: [phone] Attending Physician:  Nelta Numbers Dictated by:   Cornell Barman, P.A. Admit Date:  01/21/2002 Discharge Date: 01/26/2002   CC:         Shane Ochoa, M.D. [facility]  Shane Ochoa, M.D. [facility]   Discharge Summary  DISCHARGE DIAGNOSES: 1. Paroxysmal atrial fibrillation. 2. Alcohol withdrawal.  HISTORY OF PRESENT ILLNESS:  Shane Ochoa is a 70 year old white male with a remote history of paroxysmal atrial fibrillation, with the last occurrence being about 20 years ago.  On the day of admission, he presented with heart palpitations and dizziness.  He later felt nauseated and had three episodes of emesis.  He presented to the emergency room secondary to increased weakness. At that time he was noted to be in atrial fibrillation with a rapid ventricular response.  The patient was treated with IV Diltiazem with only modest slowing of his heart rate.  The patient was admitted for further evaluation by cardiology and treatment.  PAST MEDICAL HISTORY: 1. Hypertension. 2. Osteoarthritis. 3. Benign prostatic hypertrophy. 4. Seasonal allergic rhinitis. 5. Rosacea. 6. Alcohol abuse. 7. Status post hernia repair. 8. Status post surgery to his right face.  HOSPITAL COURSE:  #1 - Cardiovascular.  The patient was admitted and started on a Cardizem drip after receiving a Cardizem bolus of 40 mg.  Cardiac enzymes were obtained and were negative without evidence of cardiac ischemia.  The patient did convert with flocainamide.  The patient had a two-dimensional echocardiogram that revealed preserved LV function without any wall motion abnormality.  The patient was not felt to be a good candidate for anticoagulation secondary to his history of alcohol abuse and thrombocytopenia and underlying liver disease.  The patient  has maintained sinus rhythm now with a beta blocker. The patients Lopressor was changed to atenolol and if he maintains sinus rhythm, he will be discharged in the morning.  #2 - Alcohol abuse.  Initially, the patient denied alcohol use, but he did go through alcohol withdrawal.  The patient did not have any seizure activity, but did require IV benzodiazepine.  Currently, the patient is completing his oral Ativan taper.  We had several discussions with the patient and family about his alcohol abuse.  The patient is somewhat reluctant, but is willing to consider seeking treatment.  At this time, he feels that he will be able to not drink without help.  We advised the patient that this would be a difficult task and we asked for case management to talk to him about resources in the community which has been done.  He has been provided with phone numbers and addresses.  #3 - GI.  The patient has evidence of alcoholic hepatitis.  The patient also had a low platelet count again thought to be secondary to his alcohol use.  DISCHARGE LABORATORY DATA:  Magnesium 1.9, hemoglobin 11.9, hematocrit 34.9, MCV 101.1, platelet count 42.  Coags were normal.  AST 134, ALT 48, ALP 121, total bilirubin 1.9, direct bilirubin 0.6, indirect bilirubin 1.3, ammonia level mildly elevated at 40.  Total cholesterol 138, triglycerides 100, HDL 53, LDL 65.  TSH 1.232.  DISCHARGE MEDICATIONS: 1. Atenolol 50 mg 1-1/2 tablets b.i.d. 2. Hydrochlorothiazide 25 mg q.d. 3. Diovan 160 mg q.d. 4. Thiamine 100 mg q.d. 5. Folic acid 1 mg q.d. 6. Multivitamin  q.d.  Will hold off on resuming his aspirin until his platelet count has improved.  FOLLOW-UP:  With Dr. Jonny Ruiz in two to three weeks. Dictated by:   Cornell Barman, P.A. Attending Physician:  Nelta Numbers DD:  01/25/02 TD:  01/29/02 Job: [id number]

## 2010-12-25 ENCOUNTER — Encounter: Payer: Self-pay | Admitting: Internal Medicine

## 2011-02-04 ENCOUNTER — Other Ambulatory Visit (HOSPITAL_COMMUNITY): Payer: Self-pay | Admitting: Internal Medicine

## 2011-02-04 DIAGNOSIS — K7460 Unspecified cirrhosis of liver: Secondary | ICD-10-CM

## 2011-02-07 ENCOUNTER — Ambulatory Visit (HOSPITAL_COMMUNITY)
Admission: RE | Admit: 2011-02-07 | Discharge: 2011-02-07 | Disposition: A | Discharge status: Home / Self Care | Payer: Medicare Other | Source: Ambulatory Visit | Attending: Internal Medicine | Admitting: Internal Medicine

## 2011-02-07 DIAGNOSIS — K7460 Unspecified cirrhosis of liver: Secondary | ICD-10-CM | POA: Insufficient documentation

## 2011-02-07 DIAGNOSIS — R932 Abnormal findings on diagnostic imaging of liver and biliary tract: Secondary | ICD-10-CM | POA: Insufficient documentation

## 2011-07-27 DIAGNOSIS — I10 Essential (primary) hypertension: Secondary | ICD-10-CM | POA: Diagnosis not present

## 2011-07-27 DIAGNOSIS — Z79899 Other long term (current) drug therapy: Secondary | ICD-10-CM | POA: Diagnosis not present

## 2011-07-27 DIAGNOSIS — K7460 Unspecified cirrhosis of liver: Secondary | ICD-10-CM | POA: Diagnosis not present

## 2011-08-03 DIAGNOSIS — K7460 Unspecified cirrhosis of liver: Secondary | ICD-10-CM | POA: Diagnosis not present

## 2011-08-03 DIAGNOSIS — I10 Essential (primary) hypertension: Secondary | ICD-10-CM | POA: Diagnosis not present

## 2011-09-13 DIAGNOSIS — R112 Nausea with vomiting, unspecified: Secondary | ICD-10-CM | POA: Diagnosis not present

## 2011-09-13 DIAGNOSIS — R109 Unspecified abdominal pain: Secondary | ICD-10-CM | POA: Diagnosis not present

## 2011-09-20 DIAGNOSIS — K7460 Unspecified cirrhosis of liver: Secondary | ICD-10-CM | POA: Diagnosis not present

## 2011-09-20 DIAGNOSIS — E876 Hypokalemia: Secondary | ICD-10-CM | POA: Diagnosis not present

## 2011-09-20 DIAGNOSIS — A009 Cholera, unspecified: Secondary | ICD-10-CM | POA: Diagnosis not present

## 2011-10-05 ENCOUNTER — Encounter (HOSPITAL_COMMUNITY): Payer: Self-pay | Admitting: *Deleted

## 2011-10-05 ENCOUNTER — Emergency Department (HOSPITAL_COMMUNITY): Payer: Medicare Other

## 2011-10-05 ENCOUNTER — Emergency Department (HOSPITAL_COMMUNITY)
Admission: EM | Admit: 2011-10-05 | Discharge: 2011-10-05 | Disposition: A | Discharge status: Home / Self Care | Payer: Medicare Other | Attending: Emergency Medicine | Admitting: Emergency Medicine

## 2011-10-05 ENCOUNTER — Other Ambulatory Visit: Payer: Self-pay

## 2011-10-05 DIAGNOSIS — I10 Essential (primary) hypertension: Secondary | ICD-10-CM | POA: Diagnosis not present

## 2011-10-05 DIAGNOSIS — Z79899 Other long term (current) drug therapy: Secondary | ICD-10-CM | POA: Insufficient documentation

## 2011-10-05 DIAGNOSIS — R05 Cough: Secondary | ICD-10-CM | POA: Diagnosis not present

## 2011-10-05 DIAGNOSIS — J3489 Other specified disorders of nose and nasal sinuses: Secondary | ICD-10-CM | POA: Diagnosis not present

## 2011-10-05 DIAGNOSIS — R51 Headache: Secondary | ICD-10-CM | POA: Diagnosis not present

## 2011-10-05 DIAGNOSIS — R509 Fever, unspecified: Secondary | ICD-10-CM | POA: Insufficient documentation

## 2011-10-05 DIAGNOSIS — J019 Acute sinusitis, unspecified: Secondary | ICD-10-CM | POA: Diagnosis not present

## 2011-10-05 DIAGNOSIS — R110 Nausea: Secondary | ICD-10-CM | POA: Insufficient documentation

## 2011-10-05 HISTORY — DX: Essential (primary) hypertension: I10

## 2011-10-05 LAB — CBC
HCT: 39.5 % (ref 39.0–52.0)
MCH: 30.7 pg (ref 26.0–34.0)
MCHC: 34.4 g/dL (ref 30.0–36.0)
MCV: 89.2 fL (ref 78.0–100.0)
RDW: 13.3 % (ref 11.5–15.5)
WBC: 2.1 10*3/uL — ABNORMAL LOW (ref 4.0–10.5)

## 2011-10-05 LAB — DIFFERENTIAL
Basophils Absolute: 0 10*3/uL (ref 0.0–0.1)
Eosinophils Relative: 1 % (ref 0–5)
Lymphocytes Relative: 22 % (ref 12–46)
Monocytes Absolute: 0.2 10*3/uL (ref 0.1–1.0)

## 2011-10-05 LAB — BASIC METABOLIC PANEL
BUN: 8 mg/dL (ref 6–23)
CO2: 26 mEq/L (ref 19–32)
Chloride: 97 mEq/L (ref 96–112)
Creatinine, Ser: 0.87 mg/dL (ref 0.50–1.35)
Glucose, Bld: 121 mg/dL — ABNORMAL HIGH (ref 70–99)

## 2011-10-05 MED ORDER — ACETAMINOPHEN 500 MG PO TABS
1000.0000 mg | ORAL_TABLET | Freq: Once | ORAL | Status: AC
  Administered 2011-10-05: 1000 mg via ORAL
  Filled 2011-10-05: qty 2

## 2011-10-05 MED ORDER — AMOXICILLIN 500 MG PO CAPS: 500.0000 mg | ORAL_CAPSULE | Freq: Three times a day (TID) | ORAL | Status: AC

## 2011-10-05 MED ORDER — ONDANSETRON 8 MG PO TBDP
8.0000 mg | ORAL_TABLET | Freq: Once | ORAL | Status: AC
  Administered 2011-10-05: 8 mg via ORAL
  Filled 2011-10-05: qty 1

## 2011-10-05 MED ORDER — ONDANSETRON HCL 4 MG PO TABS: 8.0000 mg | ORAL_TABLET | Freq: Three times a day (TID) | ORAL | Status: AC | PRN

## 2011-10-05 NOTE — ED Notes (Signed)
Pt states, "head stopped up and congestion". Nonproductive cough at times. NAD.

## 2011-10-05 NOTE — ED Provider Notes (Signed)
History     CSN: [id number]  Arrival date & time 10/05/11  1344   First MD Initiated Contact with Patient 10/05/11 1614      Chief Complaint  Patient presents with  . Nasal Congestion    (Consider location/radiation/quality/duration/timing/severity/associated sxs/prior treatment) HPI Comments: Patient presents with a 2 week history of nasal congestion,  Pressure across his forehead,  And cough with intermittent production of yellow sputum.  He has had subjective fevers for the past 2 days, since he got over no oral virus (stated he had nausea and vomiting which resolved about 2 weeks ago), and describes ear  pressure and muffled hearing and states he gets lightheaded when he stands too quickly.  He also describes vague intermittent nausea.  He has increased postnasal drip which he states may be contributing to this nausea.  Additionally patient also would like a wound on his left hand checked.  States he sustained a laceration while working on yard last week which he treated at home using dressings and Vaseline.  It was improving and the scab remains intact, but over the past several days he has noticed increased swelling around the wound site and a tight sensation in the hand but denies fevers and denies pain at the site.  Patient is a 71 y.o. male presenting with URI. The history is provided by the patient.  URI The primary symptoms include fever and nausea. Primary symptoms do not include sore throat, swollen glands, wheezing, arthralgias or rash. Episode onset: About 2 weeks ago. The problem has been gradually worsening.  Symptoms associated with the illness include chills, plugged ear sensation, sinus pressure, congestion and rhinorrhea. The illness is not associated with facial pain. The following treatments were addressed: Acetaminophen: He has taken mucinex DM without relief. Risk factors for severe complications from URI include being elderly.    Past Medical History  Diagnosis  Date  . Hypertension   . Enlarged prostate     Past Surgical History  Procedure Date  . Hernia repair     No family history on file.  History  Substance Use Topics  . Smoking status: Never Smoker   . Smokeless tobacco: Not on file  . Alcohol Use: Yes     Occ      Review of Systems  Constitutional: Positive for fever and chills.  HENT: Positive for hearing loss, congestion, rhinorrhea and sinus pressure. Negative for sore throat and ear discharge.   Respiratory: Negative for chest tightness and wheezing.   Cardiovascular: Negative for chest pain and palpitations.  Gastrointestinal: Positive for nausea.  Musculoskeletal: Negative for arthralgias.  Skin: Negative for rash.  Neurological: Positive for light-headedness.    Allergies  Antihistamines, chlorpheniramine-type  Home Medications   Current Outpatient Rx  Name Route Sig Dispense Refill  . ESCITALOPRAM OXALATE 20 MG PO TABS Oral Take 20 mg by mouth daily.    Marland Kitchen PANTOPRAZOLE SODIUM 40 MG PO TBEC Oral Take 40 mg by mouth daily.    Marland Kitchen TAMSULOSIN HCL 0.4 MG PO CAPS Oral Take 0.4 mg by mouth daily.    . TELMISARTAN-HCTZ 40-12.5 MG PO TABS Oral Take 1 tablet by mouth daily.    . TRIAMCINOLONE ACETONIDE 55 MCG/ACT NA INHA Nasal Place 2 sprays into the nose daily.    Marland Kitchen ZOLPIDEM TARTRATE 10 MG PO TABS Oral Take 5 mg by mouth at bedtime as needed. For sleep    . AMOXICILLIN 500 MG PO CAPS Oral Take 1 capsule (500 mg total) by  mouth 3 (three) times daily. 30 capsule 0  . ONDANSETRON HCL 4 MG PO TABS Oral Take 2 tablets (8 mg total) by mouth every 8 (eight) hours as needed for nausea. 12 tablet 0    BP 162/81  Pulse 98  Temp 97.6 F (36.4 C)  Resp 16  Ht 5\' 8"  (1.727 m)  Wt 170 lb (77.111 kg)  BMI 25.85 kg/m2  SpO2 99%  Physical Exam  Nursing note and vitals reviewed. Constitutional: He is oriented to person, place, and time. He appears well-developed and well-nourished. No distress.  HENT:  Head: Normocephalic and  atraumatic.  Right Ear: External ear normal.  Left Ear: External ear normal.  Nose: Mucosal edema and rhinorrhea present. Right sinus exhibits frontal sinus tenderness. Left sinus exhibits frontal sinus tenderness.  Mouth/Throat: Uvula is midline, oropharynx is clear and moist and mucous membranes are normal.       Slight discomfort bilateral frontal sinus's.  Eyes: Conjunctivae and EOM are normal. Pupils are equal, round, and reactive to light.  Neck: Normal range of motion. Neck supple. No thyromegaly present.  Cardiovascular: Normal rate, regular rhythm, normal heart sounds and intact distal pulses.   Pulmonary/Chest: Effort normal and breath sounds normal. No respiratory distress. He has no wheezes. He has no rales. He exhibits no tenderness.  Abdominal: Soft. Bowel sounds are normal. He exhibits no distension. There is no tenderness.  Musculoskeletal: Normal range of motion. He exhibits no edema and no tenderness.  Neurological: He is alert and oriented to person, place, and time.  Skin: Skin is warm and dry.       Slight edema surrounding old scab on left dorsal hand, no increased erythema or increased warmth.  No fluctuance.  Denies pain.  Psychiatric: He has a normal mood and affect.    ED Course  Procedures (including critical care time)  Labs Reviewed  BASIC METABOLIC PANEL - Abnormal; Notable for the following:    Potassium 3.3 (*)    Glucose, Bld 121 (*)    GFR calc non Af Amer 85 (*)    All other components within normal limits  CBC - Abnormal; Notable for the following:    WBC 2.1 (*)    Platelets 80 (*)    All other components within normal limits  DIFFERENTIAL - Abnormal; Notable for the following:    Neutro Abs 1.4 (*)    Lymphs Abs 0.5 (*)    All other components within normal limits   Dg Chest 2 View  10/05/2011  *RADIOLOGY REPORT*  Clinical Data: Cough.  CHEST - 2 VIEW  Comparison: PA and lateral chest 12/20/2007.  Findings: Mild elevation of the right  hemidiaphragm is again seen. Lungs are clear.  Heart size is normal.  No pneumothorax or effusion.  No focal bony abnormality.  IMPRESSION: No acute disease.  Original Report Authenticated By: Bernadene Bell. D'ALESSIO, M.D.     1. Sinusitis acute       MDM   Patient was seen by Dr. Effie Shy prior to discharge home.  Symptoms most consistent with possible acute sinusitis, although viral etiology may also be the source.  Patient is currently taking potassium supplementation which was prescribed by his PCP given the recent neural virus symptoms.  Encouraged to continue taking these.  Also advised that his platelet count is low, and he should have this rechecked by his primary doctor at his next visit.  He was prescribed Amoxil, was encouraged to continue using Mucinex DM and 2 take  his Nasacort daily, short course of Afrin if needed for additional nasal congestion relief.     Date: 10/05/2011  Rate: 74  Rhythm: sinus arrhythmia  QRS Axis: normal  Intervals: normal  ST/T Wave abnormalities: normal  Conduction Disutrbances:none  Narrative Interpretation:   Old EKG Reviewed: prior ekg 01/21/02 - a fib with rvr      Candis Musa, PA 10/05/11 1859

## 2011-10-05 NOTE — Discharge Instructions (Signed)
Sinusitis Sinuses are air pockets within the bones of your face. The growth of bacteria within a sinus leads to infection. The infection prevents the sinuses from draining. This infection is called sinusitis. SYMPTOMS  There will be different areas of pain depending on which sinuses have become infected.  The maxillary sinuses often produce pain beneath the eyes.   Frontal sinusitis may cause pain in the middle of the forehead and above the eyes.  Other problems (symptoms) include:  Toothaches.   Colored, pus-like (purulent) drainage from the nose.   Swelling, warmth, and tenderness over the sinus areas may be signs of infection.  TREATMENT  Sinusitis is most often determined by an exam.X-rays may be taken. If x-rays have been taken, make sure you obtain your results or find out how you are to obtain them. Your caregiver may give you medications (antibiotics). These are medications that will help kill the bacteria causing the infection. You may also be given a medication (decongestant) that helps to reduce sinus swelling.  HOME CARE INSTRUCTIONS   Only take over-the-counter or prescription medicines for pain, discomfort, or fever as directed by your caregiver.   Drink extra fluids. Fluids help thin the mucus so your sinuses can drain more easily.   Applying either moist heat or ice packs to the sinus areas may help relieve discomfort.   Use saline nasal sprays to help moisten your sinuses. The sprays can be found at your local drugstore.  SEEK IMMEDIATE MEDICAL CARE IF:  You have a fever.   You have increasing pain, severe headaches, or toothache.   You have nausea, vomiting, or drowsiness.   You develop unusual swelling around the face or trouble seeing.  MAKE SURE YOU:   Understand these instructions.   Will watch your condition.   Will get help right away if you are not doing well or get worse.  Document Released: 07/04/2005 Document Revised: 06/23/2011 Document Reviewed:  01/31/2007 [facility] Patient Information 2012 [place], [place].   Start using urine Nasonex twice daily for the next month which will help improve your nasal congestion and sinus pressure.  Take antibiotics as prescribed until completed.  You may also use Afrin per the label instructions if needed for additional relief of nasal congestion and pressure.  However, use caution with this medication as it can elevate your blood pressure.  Make sure you had your blood pressure rechecked in a couple of days to make sure that it is under control.  He may also use the Zofran medication prescribed if needed for continued nausea.

## 2011-10-05 NOTE — ED Provider Notes (Signed)
Medical screening examination/treatment/procedure(s) were conducted as a shared visit with non-physician practitioner(s) and myself.  I personally evaluated the patient during the encounter  71 year old male with signs, and symptoms of acute sinusitis. He is producing greenish sputum from the nose. He has no fever. On exam he is anxious and uncomfortable. His right hand has a bruise without infection. He is stable for discharge with outpatient management for sinusitis.  Flint Melter, MD 10/05/11 [phone]

## 2011-10-09 NOTE — ED Provider Notes (Signed)
Medical screening examination/treatment/procedure(s) were performed by non-physician practitioner and as supervising physician I was immediately available for consultation/collaboration.  Klair Leising L Mehreen Azizi, MD 10/09/11 1145 

## 2011-11-03 DIAGNOSIS — R42 Dizziness and giddiness: Secondary | ICD-10-CM | POA: Diagnosis not present

## 2011-11-15 DIAGNOSIS — J329 Chronic sinusitis, unspecified: Secondary | ICD-10-CM | POA: Diagnosis not present

## 2012-01-25 DIAGNOSIS — R21 Rash and other nonspecific skin eruption: Secondary | ICD-10-CM | POA: Diagnosis not present

## 2012-04-12 ENCOUNTER — Encounter: Payer: Self-pay | Admitting: Internal Medicine

## 2012-06-20 DIAGNOSIS — Z23 Encounter for immunization: Secondary | ICD-10-CM | POA: Diagnosis not present

## 2012-07-31 DIAGNOSIS — D235 Other benign neoplasm of skin of trunk: Secondary | ICD-10-CM | POA: Diagnosis not present

## 2012-07-31 DIAGNOSIS — L723 Sebaceous cyst: Secondary | ICD-10-CM | POA: Diagnosis not present

## 2012-10-29 DIAGNOSIS — M1710 Unilateral primary osteoarthritis, unspecified knee: Secondary | ICD-10-CM | POA: Diagnosis not present

## 2013-01-29 DIAGNOSIS — B029 Zoster without complications: Secondary | ICD-10-CM | POA: Diagnosis not present

## 2013-03-20 DIAGNOSIS — I10 Essential (primary) hypertension: Secondary | ICD-10-CM | POA: Diagnosis not present

## 2013-03-20 DIAGNOSIS — K7030 Alcoholic cirrhosis of liver without ascites: Secondary | ICD-10-CM | POA: Diagnosis not present

## 2013-03-20 DIAGNOSIS — Z79899 Other long term (current) drug therapy: Secondary | ICD-10-CM | POA: Diagnosis not present

## 2013-03-27 ENCOUNTER — Other Ambulatory Visit (HOSPITAL_COMMUNITY): Payer: Self-pay | Admitting: Pulmonary Disease

## 2013-03-27 DIAGNOSIS — I10 Essential (primary) hypertension: Secondary | ICD-10-CM | POA: Diagnosis not present

## 2013-03-27 DIAGNOSIS — K7460 Unspecified cirrhosis of liver: Secondary | ICD-10-CM

## 2013-03-29 ENCOUNTER — Ambulatory Visit (HOSPITAL_COMMUNITY)
Admission: RE | Admit: 2013-03-29 | Discharge: 2013-03-29 | Disposition: A | Discharge status: Home / Self Care | Payer: Medicare Other | Source: Ambulatory Visit | Attending: Pulmonary Disease | Admitting: Pulmonary Disease

## 2013-03-29 DIAGNOSIS — R7989 Other specified abnormal findings of blood chemistry: Secondary | ICD-10-CM | POA: Diagnosis not present

## 2013-03-29 DIAGNOSIS — K7460 Unspecified cirrhosis of liver: Secondary | ICD-10-CM | POA: Diagnosis not present

## 2013-04-01 ENCOUNTER — Other Ambulatory Visit (HOSPITAL_COMMUNITY): Payer: Self-pay | Admitting: Internal Medicine

## 2013-04-01 DIAGNOSIS — K7460 Unspecified cirrhosis of liver: Secondary | ICD-10-CM

## 2013-05-19 DIAGNOSIS — Z23 Encounter for immunization: Secondary | ICD-10-CM | POA: Diagnosis not present

## 2013-06-17 ENCOUNTER — Telehealth: Payer: Self-pay | Admitting: Cardiology

## 2013-06-17 NOTE — Telephone Encounter (Signed)
**Note De-Identified Shane Ochoa Obfuscation** An appt to see Dr Myrtis Ser has been scheduled for 09/03/13 at 10 am. Per Dr Myrtis Ser the pt is advised to f/u with his pcp, Dr Ouida Sills, concerning his dizziness, sob and excessive sleeping. The pt verbalized understanding.

## 2013-06-17 NOTE — Telephone Encounter (Signed)
New message  Patient is not feeling well. He is sleeping about 10 hours a day, he is having dizziness and SOB. Can I please work this patient in? Please advise.

## 2013-06-24 DIAGNOSIS — F329 Major depressive disorder, single episode, unspecified: Secondary | ICD-10-CM | POA: Diagnosis not present

## 2013-07-09 DIAGNOSIS — H2510 Age-related nuclear cataract, unspecified eye: Secondary | ICD-10-CM | POA: Diagnosis not present

## 2013-07-17 DIAGNOSIS — K7460 Unspecified cirrhosis of liver: Secondary | ICD-10-CM | POA: Diagnosis not present

## 2013-07-17 DIAGNOSIS — G4700 Insomnia, unspecified: Secondary | ICD-10-CM | POA: Diagnosis not present

## 2013-07-17 DIAGNOSIS — F329 Major depressive disorder, single episode, unspecified: Secondary | ICD-10-CM | POA: Diagnosis not present

## 2013-08-23 DIAGNOSIS — M79609 Pain in unspecified limb: Secondary | ICD-10-CM | POA: Diagnosis not present

## 2013-08-23 DIAGNOSIS — M1710 Unilateral primary osteoarthritis, unspecified knee: Secondary | ICD-10-CM | POA: Diagnosis not present

## 2013-08-23 DIAGNOSIS — IMO0002 Reserved for concepts with insufficient information to code with codable children: Secondary | ICD-10-CM | POA: Diagnosis not present

## 2013-09-01 ENCOUNTER — Encounter: Payer: Self-pay | Admitting: Cardiology

## 2013-09-01 DIAGNOSIS — R9430 Abnormal result of cardiovascular function study, unspecified: Secondary | ICD-10-CM | POA: Insufficient documentation

## 2013-09-03 ENCOUNTER — Ambulatory Visit: Payer: Medicare Other | Admitting: Cardiology

## 2013-09-18 DIAGNOSIS — I10 Essential (primary) hypertension: Secondary | ICD-10-CM | POA: Diagnosis not present

## 2013-09-18 DIAGNOSIS — K7030 Alcoholic cirrhosis of liver without ascites: Secondary | ICD-10-CM | POA: Diagnosis not present

## 2013-09-18 DIAGNOSIS — Z79899 Other long term (current) drug therapy: Secondary | ICD-10-CM | POA: Diagnosis not present

## 2013-09-25 DIAGNOSIS — K7460 Unspecified cirrhosis of liver: Secondary | ICD-10-CM | POA: Diagnosis not present

## 2013-09-25 DIAGNOSIS — F329 Major depressive disorder, single episode, unspecified: Secondary | ICD-10-CM | POA: Diagnosis not present

## 2013-09-25 DIAGNOSIS — F3289 Other specified depressive episodes: Secondary | ICD-10-CM | POA: Diagnosis not present

## 2013-09-25 DIAGNOSIS — I10 Essential (primary) hypertension: Secondary | ICD-10-CM | POA: Diagnosis not present

## 2013-10-04 ENCOUNTER — Encounter: Payer: Self-pay | Admitting: Cardiology

## 2013-10-04 DIAGNOSIS — IMO0002 Reserved for concepts with insufficient information to code with codable children: Secondary | ICD-10-CM | POA: Diagnosis not present

## 2013-10-04 DIAGNOSIS — M1710 Unilateral primary osteoarthritis, unspecified knee: Secondary | ICD-10-CM | POA: Diagnosis not present

## 2013-10-07 ENCOUNTER — Encounter: Payer: Self-pay | Admitting: Cardiology

## 2013-10-07 ENCOUNTER — Ambulatory Visit (INDEPENDENT_AMBULATORY_CARE_PROVIDER_SITE_OTHER): Payer: Medicare Other | Admitting: Cardiology

## 2013-10-07 VITALS — BP 142/90 | HR 67 | Ht 68.0 in | Wt 176.4 lb

## 2013-10-07 DIAGNOSIS — R0989 Other specified symptoms and signs involving the circulatory and respiratory systems: Secondary | ICD-10-CM

## 2013-10-07 DIAGNOSIS — Z8679 Personal history of other diseases of the circulatory system: Secondary | ICD-10-CM

## 2013-10-07 DIAGNOSIS — R9430 Abnormal result of cardiovascular function study, unspecified: Secondary | ICD-10-CM

## 2013-10-07 DIAGNOSIS — I10 Essential (primary) hypertension: Secondary | ICD-10-CM

## 2013-10-07 NOTE — Patient Instructions (Signed)
**Note De-identified Shane Ochoa Obfuscation** Your physician recommends that you continue on your current medications as directed. Please refer to the Current Medication list given to you today.  Your physician recommends that you schedule a follow-up appointment in: as needed  

## 2013-10-07 NOTE — Assessment & Plan Note (Signed)
The patient had atrial fibrillation in the remote past. He currently has sinus rhythm. He does not have any significant palpitations. No further workup is needed.

## 2013-10-07 NOTE — Assessment & Plan Note (Signed)
We know that the patient's ejection fraction was normal in 2003. He has been stable. There is no definite need for followup echo at this time.  As part of today's evaluation I spent greater than 25 minutes with his total care. More than half of this time was spent with direct contact with him discussing his prior history and his current situation. He is doing quite well. He does not need formal cardiology followup going forward.

## 2013-10-07 NOTE — Progress Notes (Signed)
Patient ID: Shane Ochoa, male   DOB: Feb 19, 1941, 73 y.o.   MRN: [id number]    HPI  Patient is seen today for long-term followup of atrial fibrillation and hypertension. He's doing well. He brought me labs today from Dr. Willey Blade. They look quite good. The patient has not had any significant palpitations. His blood pressure is controlled. He works on a regular basis without any difficulties.  I saw him last in the office in July, 2011. I have carefully reviewed the old records and updated the current electronic medical record.  Allergies  Allergen Reactions  . Antihistamines, Chlorpheniramine-Type     Unable to take due to prostate.    Current Outpatient Prescriptions  Medication Sig Dispense Refill  . aspirin 81 MG tablet Take 81 mg by mouth daily.      Marland Kitchen buPROPion (ZYBAN) 150 MG 12 hr tablet Take 150 mg by mouth daily.      Marland Kitchen escitalopram (LEXAPRO) 20 MG tablet Take 20 mg by mouth daily.      Marland Kitchen losartan-hydrochlorothiazide (HYZAAR) 100-25 MG per tablet Take 1 tablet by mouth daily.      . pantoprazole (PROTONIX) 40 MG tablet Take 40 mg by mouth daily.      . Tamsulosin HCl (FLOMAX) 0.4 MG CAPS Take 0.4 mg by mouth daily.      Marland Kitchen zolpidem (AMBIEN) 10 MG tablet Take 5 mg by mouth at bedtime as needed. For sleep       No current facility-administered medications for this visit.    History   Social History  . Marital Status: Married    Spouse Name: N/A    Number of Children: N/A  . Years of Education: N/A   Occupational History  . Not on file.   Social History Main Topics  . Smoking status: Never Smoker   . Smokeless tobacco: Not on file  . Alcohol Use: Yes     Comment: Occ  . Drug Use: No  . Sexual Activity:    Other Topics Concern  . Not on file   Social History Narrative  . No narrative on file    History reviewed. No pertinent family history.  Past Medical History  Diagnosis Date  . Hypertension   . Enlarged prostate   . Paroxysmal atrial fibrillation   .  Anxiety and depression   . Ejection fraction     Past Surgical History  Procedure Laterality Date  . Hernia repair      Patient Active Problem List   Diagnosis Date Noted  . Ejection fraction   . ANEMIA-NOS 08/07/2007  . GERD 08/07/2007  . IBS 08/07/2007  . RASH-NONVESICULAR 08/07/2007  . FREQUENCY, URINARY 08/07/2007  . COLONIC POLYPS, HX OF 08/07/2007  . 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 HEMORRHOIDS, INTERNAL 08/02/2007  . RECTAL BLEEDING 08/02/2007  . CIRRHOSIS OF LIVER WITHOUT MENTION OF ALCOHOL 08/02/2007  . THROMBOCYTOPENIA 04/06/2007  . ALLERGIC RHINITIS 04/06/2007  . DIVERTICULOSIS, COLON 04/06/2007  . BENIGN PROSTATIC HYPERTROPHY 04/06/2007  . 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  . ALCOHOLIC HEPATITIS, HX OF 04/06/2007  . ANXIETY 03/31/2007  . ALCOHOL ABUSE 03/31/2007  . DEPRESSION 03/31/2007  . HYPERTENSION 03/31/2007  . INGUINAL HERNIA, LEFT 03/31/2007  . ATRIAL FIBRILLATION, PAROXYSMAL, HX OF 03/31/2007  . Personal History of Urinary Calculi 03/31/2007    ROS   Patient denies fever, chills, headache, sweats, rash, change in vision, change in hearing, chest pain, cough, nausea or vomiting, urinary symptoms. All other systems are reviewed and are negative.  PHYSICAL EXAM  Patient is oriented to person time and place. Affect is normal. There is no jugulovenous distention. Lungs are clear. Respiratory effort is nonlabored. Cardiac exam reveals an S1 and S2. There are no clicks or significant murmurs. The abdomen is soft. There is no peripheral edema.  Filed Vitals:   10/07/13 0909  BP: 142/90  Pulse: 67  Height: 5\' 8"  (1.727 m)  Weight: 176 lb 6.4 oz (80.015 kg)   EKG is done today and reviewed by me. There is normal sinus rhythm. There is no significant change from the past. There is no significant abnormality.  ASSESSMENT & PLAN

## 2013-10-07 NOTE — Assessment & Plan Note (Signed)
Blood pressure is controlled. He is top normal in the office today. I know was normal when he was seen by his primary physician recently. No change in therapy.

## 2013-10-21 DIAGNOSIS — M545 Low back pain, unspecified: Secondary | ICD-10-CM | POA: Diagnosis not present

## 2013-10-21 DIAGNOSIS — M999 Biomechanical lesion, unspecified: Secondary | ICD-10-CM | POA: Diagnosis not present

## 2013-10-21 DIAGNOSIS — M546 Pain in thoracic spine: Secondary | ICD-10-CM | POA: Diagnosis not present

## 2013-10-25 DIAGNOSIS — M545 Low back pain, unspecified: Secondary | ICD-10-CM | POA: Diagnosis not present

## 2013-10-25 DIAGNOSIS — M999 Biomechanical lesion, unspecified: Secondary | ICD-10-CM | POA: Diagnosis not present

## 2013-10-25 DIAGNOSIS — M546 Pain in thoracic spine: Secondary | ICD-10-CM | POA: Diagnosis not present

## 2013-10-28 ENCOUNTER — Ambulatory Visit (INDEPENDENT_AMBULATORY_CARE_PROVIDER_SITE_OTHER): Payer: Medicare Other

## 2013-10-28 ENCOUNTER — Ambulatory Visit: Payer: Medicare Other | Admitting: Podiatry

## 2013-10-28 ENCOUNTER — Encounter: Payer: Self-pay | Admitting: Podiatry

## 2013-10-28 VITALS — BP 127/60 | HR 63 | Resp 16

## 2013-10-28 DIAGNOSIS — M545 Low back pain, unspecified: Secondary | ICD-10-CM | POA: Diagnosis not present

## 2013-10-28 DIAGNOSIS — L600 Ingrowing nail: Secondary | ICD-10-CM | POA: Diagnosis not present

## 2013-10-28 DIAGNOSIS — M779 Enthesopathy, unspecified: Secondary | ICD-10-CM

## 2013-10-28 DIAGNOSIS — M999 Biomechanical lesion, unspecified: Secondary | ICD-10-CM | POA: Diagnosis not present

## 2013-10-28 DIAGNOSIS — M546 Pain in thoracic spine: Secondary | ICD-10-CM | POA: Diagnosis not present

## 2013-10-28 MED ORDER — TRIAMCINOLONE ACETONIDE 10 MG/ML IJ SUSP
10.0000 mg | Freq: Once | INTRAMUSCULAR | Status: AC
  Administered 2013-10-28: 10 mg

## 2013-10-28 NOTE — Progress Notes (Signed)
   Subjective:    Patient ID: Shane Ochoa, male    DOB: 02-17-1941, 73 y.o.   MRN: [id number]  HPI Comments: "I have a sore area on my foot"  Patient c/o aching plantar forefoot and 2nd toe right for 2-3 weeks. He does remember an injury where a guy dropped a piece of pipe on his foot. Does have AM pain. The area is swollen and toe is swollen and red. Very tender with walking. Tried Advil-not much help.  Also, he has an ingrown toenail right great toe, lateral border.     Review of Systems  Cardiovascular:       Calf pain with walking   Musculoskeletal: Positive for arthralgias, back pain and myalgias.  All other systems reviewed and are negative.      Objective:   Physical Exam        Assessment & Plan:

## 2013-10-28 NOTE — Patient Instructions (Signed)

## 2013-10-29 NOTE — Progress Notes (Signed)
Subjective:     Patient ID: Shane Ochoa, male   DOB: 10-Apr-1941, 73 y.o.   MRN: [id number]  Foot Pain   patient presents stating I have a lot of pain in my forefoot right and I am ingrown toenail my right big toe that it's been sore. Patient is very active and works full-time   Review of Systems  All other systems reviewed and are negative.      Objective:   Physical Exam  Nursing note and vitals reviewed. Constitutional: He is oriented to person, place, and time.  Cardiovascular: Intact distal pulses.   Musculoskeletal: Normal range of motion.  Neurological: He is oriented to person, place, and time.  Skin: Skin is warm.   neurovascular status intact with range of motion adequate subtalar midtarsal joint and muscle strength adequate. Edema in the second digit right and pain second metatarsophalangeal right with no drainage no odor or proximal edema erythema noted incurvated nail bed right hallux lateral border that is very tender when pressed     Assessment:     Probable capsulitis second MPJ right even though it is swollen toe I'm to watch it carefully and ingrown toenail right    Plan:     H&P and x-rays reviewed with patient. I recommended working on both these problems and explained we may need to do more in the future. Today I infiltrated the right forefoot 60 mg Xylocaine Marcaine mixture aspirated the second MPJ was able to get out Featherly amount of clear fluid and injected with a quarter cc dexamethasone Kenalog combination. Ingrown toenail I recommended correction and explained risk. I infiltrated 60 mg Xylocaine Marcaine mixture remove the lateral border exposed matrix and applied phenol 3 applications 30 seconds followed by alcohol lavaged and sterile dressing. Instructed on soaks and reappoint in 1 week

## 2013-11-11 ENCOUNTER — Ambulatory Visit (INDEPENDENT_AMBULATORY_CARE_PROVIDER_SITE_OTHER): Payer: Medicare Other | Admitting: Podiatry

## 2013-11-11 ENCOUNTER — Encounter: Payer: Self-pay | Admitting: Podiatry

## 2013-11-11 VITALS — BP 118/65 | HR 68 | Resp 16

## 2013-11-11 DIAGNOSIS — M779 Enthesopathy, unspecified: Secondary | ICD-10-CM

## 2013-11-11 NOTE — Progress Notes (Signed)
Subjective:     Patient ID: Shane Ochoa, male   DOB: July 08, 1941, 73 y.o.   MRN: [id number]  HPI patient presents stating my right ingrown is feeling very well but the joint is still hurting second metatarsal and I am trying to work every day and it has been very sore   Review of Systems     Objective:   Physical Exam Neurovascular status intact with ingrown toenail right hallux healing well and second MPJ right still inflamed with pain around the joint surface    Assessment:     Continued capsulitis right second MPJ with well-healing surgical site right big toe    Plan:     Advised on physical therapy rigid bottom shoes and scanned for custom orthotics to disperse weight across the joint. Reappoint when orthotics are ready and discussed Sunday this may require surgery

## 2013-11-29 ENCOUNTER — Other Ambulatory Visit: Payer: Medicare Other

## 2013-12-02 ENCOUNTER — Other Ambulatory Visit: Payer: Medicare Other

## 2013-12-06 ENCOUNTER — Ambulatory Visit (INDEPENDENT_AMBULATORY_CARE_PROVIDER_SITE_OTHER): Payer: Medicare Other | Admitting: *Deleted

## 2013-12-06 DIAGNOSIS — M779 Enthesopathy, unspecified: Secondary | ICD-10-CM | POA: Diagnosis not present

## 2013-12-06 NOTE — Progress Notes (Signed)
Dispensed patient's orthotics with oral and written instructions for wearing. Patient will follow up with Dr. Regal as needed. 

## 2013-12-06 NOTE — Patient Instructions (Signed)
WEARING INSTRUCTIONS FOR ORTHOTICS  Don't expect to be comfortable wearing your orthotic devices for the first time.  Like eyeglasses, you may be aware of them as time passes, they will not be uncomfortable and you will enjoy wearing them.  FOLLOW THESE INSTRUCTIONS EXACTLY!  1. Wear your orthotic devices for:       Not more than 1 hour the first day.       Not more than 2 hours the second day.       Not more than 3 hours the third day and so on.        Or wear them for as long as they feel comfortable.       If you experience discomfort in your feet or legs take them out.  When feet & legs feel       better, put them back in.  You do need to be consistent and wear them a little        everyday. 2.   If at any time the orthotic devices become acutely uncomfortable before the       time for that particular day, STOP WEARING THEM. 3.   On the next day, do not increase the wearing time. 4.   Subsequently, increase the wearing time by 15-30 minutes only if comfortable to do       so. 5.   You will be seen by your doctor about 2-4 weeks after you receive your orthotic       devices, at which time you will probably be wearing your devices comfortably        for about 8 hours or more a day. 6.   Some patients occasionally report mild aches or discomfort in other parts of the of       body such as the knees, hips or back after 3 or 4 consecutive hours of wear.  If this       is the case with you, do not extend your wearing time.  Instead, cut it back an hour or       two.  In all likelihood, these symptoms will disappear in a short period of time as your       body posture realigns itself and functions more efficiently. 7.   It is possible that your orthotic device may require some Meissner changes or adjustment       to improve their function or make them more comfortable.   This is usually not done       before one to three months have elapsed.  These adjustments are made in        accordance  with the changed position your feet are assuming as a result of       improved biomechanical function. 8.   In women's shoes, it's not unusual for your heel to slip out of the shoe, particularly if       they are step-in-shoes.  If this is the case, try other shoes or other styles.  Try to       purchase shoes which have deeper heal seats or higher heel counters. 9.   Squeaking of orthotics devices in the shoes is due to the movement of the devices       when they are functioning normally.  To eliminate squeaking, simply dust some       baby powder into your shoes before inserting the devices.  If this does not work,          apply soap or wax to the edges of the orthotic devices or put a tissue into the shoes. 10. It is important that you follow these directions explicitly.  Failure to do so will simply       prolong the adjustment period or create problems which are easily avoided.  It makes       no difference if you are wearing your orthotic devices for only a few hours after        several months, so long as you are wearing them comfortably for those hours. 11. If you have any questions or complaints, contact our office.  We have no way of       knowing about your problems unless you tell us.  If we do not hear from you, we will       assume that you are proceeding well.  

## 2013-12-24 DIAGNOSIS — M545 Low back pain, unspecified: Secondary | ICD-10-CM | POA: Diagnosis not present

## 2013-12-24 DIAGNOSIS — M546 Pain in thoracic spine: Secondary | ICD-10-CM | POA: Diagnosis not present

## 2013-12-24 DIAGNOSIS — M999 Biomechanical lesion, unspecified: Secondary | ICD-10-CM | POA: Diagnosis not present

## 2013-12-30 ENCOUNTER — Encounter: Payer: Self-pay | Admitting: Podiatry

## 2013-12-30 ENCOUNTER — Ambulatory Visit (INDEPENDENT_AMBULATORY_CARE_PROVIDER_SITE_OTHER): Payer: Medicare Other | Admitting: Podiatry

## 2013-12-30 VITALS — BP 133/65 | HR 88 | Resp 18

## 2013-12-30 DIAGNOSIS — M779 Enthesopathy, unspecified: Secondary | ICD-10-CM

## 2013-12-30 DIAGNOSIS — M109 Gout, unspecified: Secondary | ICD-10-CM | POA: Diagnosis not present

## 2013-12-30 LAB — RHEUMATOID FACTOR

## 2013-12-30 LAB — SEDIMENTATION RATE: Sed Rate: 4 mm/hr (ref 0–16)

## 2013-12-30 LAB — URIC ACID: Uric Acid, Serum: 6.4 mg/dL (ref 4.0–7.8)

## 2013-12-30 LAB — C-REACTIVE PROTEIN

## 2013-12-30 MED ORDER — TRIAMCINOLONE ACETONIDE 10 MG/ML IJ SUSP
10.0000 mg | Freq: Once | INTRAMUSCULAR | Status: AC
  Administered 2013-12-30: 10 mg

## 2013-12-30 MED ORDER — PREDNISONE 10 MG PO TABS: ORAL_TABLET | ORAL | Status: DC

## 2013-12-30 NOTE — Progress Notes (Signed)
Subjective:     Patient ID: Shane Ochoa, male   DOB: [birth year], 73 y.o.   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 Did proximal block aspirated the joint giving him a Spillman amount of fluid and injected the second MPJ with a quarter cc of dexamethasone Kenalog in the second toe with a quarter cc of dexamethasone Kenalog. Reappoint to review the findings of what work in the next 2 weeks

## 2013-12-30 NOTE — Progress Notes (Signed)
° °  Subjective:    Patient ID: Shane Ochoa, male    DOB: 12/22/1940, 73 y.o.   MRN: [id number]  HPI I have been better and I am still limping and sore and tender on the right foot and I have my inserts in my work boots and they don't seem to be helping    Review of Systems     Objective:   Physical Exam        Assessment & Plan:

## 2013-12-31 LAB — ANA: ANA: NEGATIVE

## 2014-01-13 ENCOUNTER — Ambulatory Visit: Payer: Medicare Other | Admitting: Podiatry

## 2014-01-13 ENCOUNTER — Encounter: Payer: Self-pay | Admitting: Podiatry

## 2014-01-13 VITALS — BP 130/65 | HR 88 | Resp 16

## 2014-01-13 DIAGNOSIS — M779 Enthesopathy, unspecified: Secondary | ICD-10-CM

## 2014-01-13 NOTE — Progress Notes (Signed)
Subjective:     Patient ID: Shane Ochoa, male   DOB: 11-21-1940, 73 y.o.   MRN: [id number]  HPI patient states my right foot is doing real well and the pain has pretty much gone away   Review of Systems     Objective:   Physical Exam Neurovascular status intact with no change in health history and patient found to have significant diminishment of pain second MPJ right with blood work that I reviewed indicating normal numbers at this time    Assessment:     Probable capsulitis the second MPJ right which has improved quite nicely    Plan:     Discuss shoe gear recommendations and rigid bottom-type shoes for the future. Dispensed graphite insole in order to prevent forefoot bending and reappoint her recheck if symptoms indicate

## 2014-01-20 DIAGNOSIS — J069 Acute upper respiratory infection, unspecified: Secondary | ICD-10-CM | POA: Diagnosis not present

## 2014-01-23 DIAGNOSIS — K7030 Alcoholic cirrhosis of liver without ascites: Secondary | ICD-10-CM | POA: Diagnosis not present

## 2014-01-23 DIAGNOSIS — I10 Essential (primary) hypertension: Secondary | ICD-10-CM | POA: Diagnosis not present

## 2014-01-23 DIAGNOSIS — Z79899 Other long term (current) drug therapy: Secondary | ICD-10-CM | POA: Diagnosis not present

## 2014-01-24 ENCOUNTER — Other Ambulatory Visit (HOSPITAL_COMMUNITY): Payer: Self-pay | Admitting: Internal Medicine

## 2014-01-24 ENCOUNTER — Ambulatory Visit (HOSPITAL_COMMUNITY)
Admission: RE | Admit: 2014-01-24 | Discharge: 2014-01-24 | Disposition: A | Discharge status: Home / Self Care | Payer: Medicare Other | Source: Ambulatory Visit | Attending: Internal Medicine | Admitting: Internal Medicine

## 2014-01-24 DIAGNOSIS — J449 Chronic obstructive pulmonary disease, unspecified: Secondary | ICD-10-CM | POA: Diagnosis not present

## 2014-01-24 DIAGNOSIS — R059 Cough, unspecified: Secondary | ICD-10-CM | POA: Diagnosis not present

## 2014-01-24 DIAGNOSIS — R05 Cough: Secondary | ICD-10-CM

## 2014-01-30 ENCOUNTER — Other Ambulatory Visit (HOSPITAL_COMMUNITY): Payer: Self-pay | Admitting: Internal Medicine

## 2014-01-30 DIAGNOSIS — I10 Essential (primary) hypertension: Secondary | ICD-10-CM | POA: Diagnosis not present

## 2014-01-30 DIAGNOSIS — K7460 Unspecified cirrhosis of liver: Secondary | ICD-10-CM | POA: Diagnosis not present

## 2014-01-30 DIAGNOSIS — F3289 Other specified depressive episodes: Secondary | ICD-10-CM | POA: Diagnosis not present

## 2014-01-30 DIAGNOSIS — K7030 Alcoholic cirrhosis of liver without ascites: Secondary | ICD-10-CM

## 2014-01-30 DIAGNOSIS — F329 Major depressive disorder, single episode, unspecified: Secondary | ICD-10-CM | POA: Diagnosis not present

## 2014-05-05 ENCOUNTER — Ambulatory Visit (HOSPITAL_COMMUNITY)
Admission: RE | Admit: 2014-05-05 | Discharge: 2014-05-05 | Disposition: A | Discharge status: Home / Self Care | Payer: Medicare Other | Source: Ambulatory Visit | Attending: Internal Medicine | Admitting: Internal Medicine

## 2014-05-05 DIAGNOSIS — I480 Paroxysmal atrial fibrillation: Secondary | ICD-10-CM | POA: Diagnosis not present

## 2014-05-05 DIAGNOSIS — K7460 Unspecified cirrhosis of liver: Secondary | ICD-10-CM | POA: Diagnosis not present

## 2014-05-05 DIAGNOSIS — Z79899 Other long term (current) drug therapy: Secondary | ICD-10-CM | POA: Diagnosis not present

## 2014-05-05 DIAGNOSIS — I10 Essential (primary) hypertension: Secondary | ICD-10-CM | POA: Diagnosis not present

## 2014-05-05 DIAGNOSIS — R42 Dizziness and giddiness: Secondary | ICD-10-CM | POA: Diagnosis not present

## 2014-05-05 DIAGNOSIS — K7030 Alcoholic cirrhosis of liver without ascites: Secondary | ICD-10-CM | POA: Insufficient documentation

## 2014-05-13 DIAGNOSIS — F329 Major depressive disorder, single episode, unspecified: Secondary | ICD-10-CM | POA: Diagnosis not present

## 2014-05-13 DIAGNOSIS — K7460 Unspecified cirrhosis of liver: Secondary | ICD-10-CM | POA: Diagnosis not present

## 2014-05-13 DIAGNOSIS — Z23 Encounter for immunization: Secondary | ICD-10-CM | POA: Diagnosis not present

## 2014-05-13 DIAGNOSIS — I10 Essential (primary) hypertension: Secondary | ICD-10-CM | POA: Diagnosis not present

## 2014-08-05 DIAGNOSIS — H2513 Age-related nuclear cataract, bilateral: Secondary | ICD-10-CM | POA: Diagnosis not present

## 2014-08-13 DIAGNOSIS — H2512 Age-related nuclear cataract, left eye: Secondary | ICD-10-CM | POA: Diagnosis not present

## 2014-08-20 DIAGNOSIS — H2512 Age-related nuclear cataract, left eye: Secondary | ICD-10-CM | POA: Diagnosis not present

## 2014-08-20 DIAGNOSIS — H21562 Pupillary abnormality, left eye: Secondary | ICD-10-CM | POA: Diagnosis not present

## 2014-09-11 DIAGNOSIS — Z79899 Other long term (current) drug therapy: Secondary | ICD-10-CM | POA: Diagnosis not present

## 2014-09-11 DIAGNOSIS — K7030 Alcoholic cirrhosis of liver without ascites: Secondary | ICD-10-CM | POA: Diagnosis not present

## 2014-09-11 DIAGNOSIS — I10 Essential (primary) hypertension: Secondary | ICD-10-CM | POA: Diagnosis not present

## 2014-09-18 DIAGNOSIS — K7460 Unspecified cirrhosis of liver: Secondary | ICD-10-CM | POA: Diagnosis not present

## 2014-09-18 DIAGNOSIS — I10 Essential (primary) hypertension: Secondary | ICD-10-CM | POA: Diagnosis not present

## 2014-09-18 DIAGNOSIS — F329 Major depressive disorder, single episode, unspecified: Secondary | ICD-10-CM | POA: Diagnosis not present

## 2014-09-18 DIAGNOSIS — Z23 Encounter for immunization: Secondary | ICD-10-CM | POA: Diagnosis not present

## 2014-10-15 DIAGNOSIS — T783XXA Angioneurotic edema, initial encounter: Secondary | ICD-10-CM | POA: Diagnosis not present

## 2014-10-15 DIAGNOSIS — I10 Essential (primary) hypertension: Secondary | ICD-10-CM | POA: Diagnosis not present

## 2014-10-30 DIAGNOSIS — B009 Herpesviral infection, unspecified: Secondary | ICD-10-CM | POA: Diagnosis not present

## 2014-10-30 DIAGNOSIS — I10 Essential (primary) hypertension: Secondary | ICD-10-CM | POA: Diagnosis not present

## 2014-11-20 DIAGNOSIS — M25562 Pain in left knee: Secondary | ICD-10-CM | POA: Diagnosis not present

## 2014-11-20 DIAGNOSIS — M7541 Impingement syndrome of right shoulder: Secondary | ICD-10-CM | POA: Diagnosis not present

## 2014-11-20 DIAGNOSIS — M25561 Pain in right knee: Secondary | ICD-10-CM | POA: Diagnosis not present

## 2014-11-20 DIAGNOSIS — M25511 Pain in right shoulder: Secondary | ICD-10-CM | POA: Diagnosis not present

## 2015-04-16 IMAGING — CR DG CHEST 2V
2 series · 2 of 2 positions shown · non-contrast
Comparison: PA and lateral chest x-ray October 05, 2011

CLINICAL DATA: Eight day history of cough.

EXAM:
CHEST  2 VIEW

[view not recorded (1 of 2)]
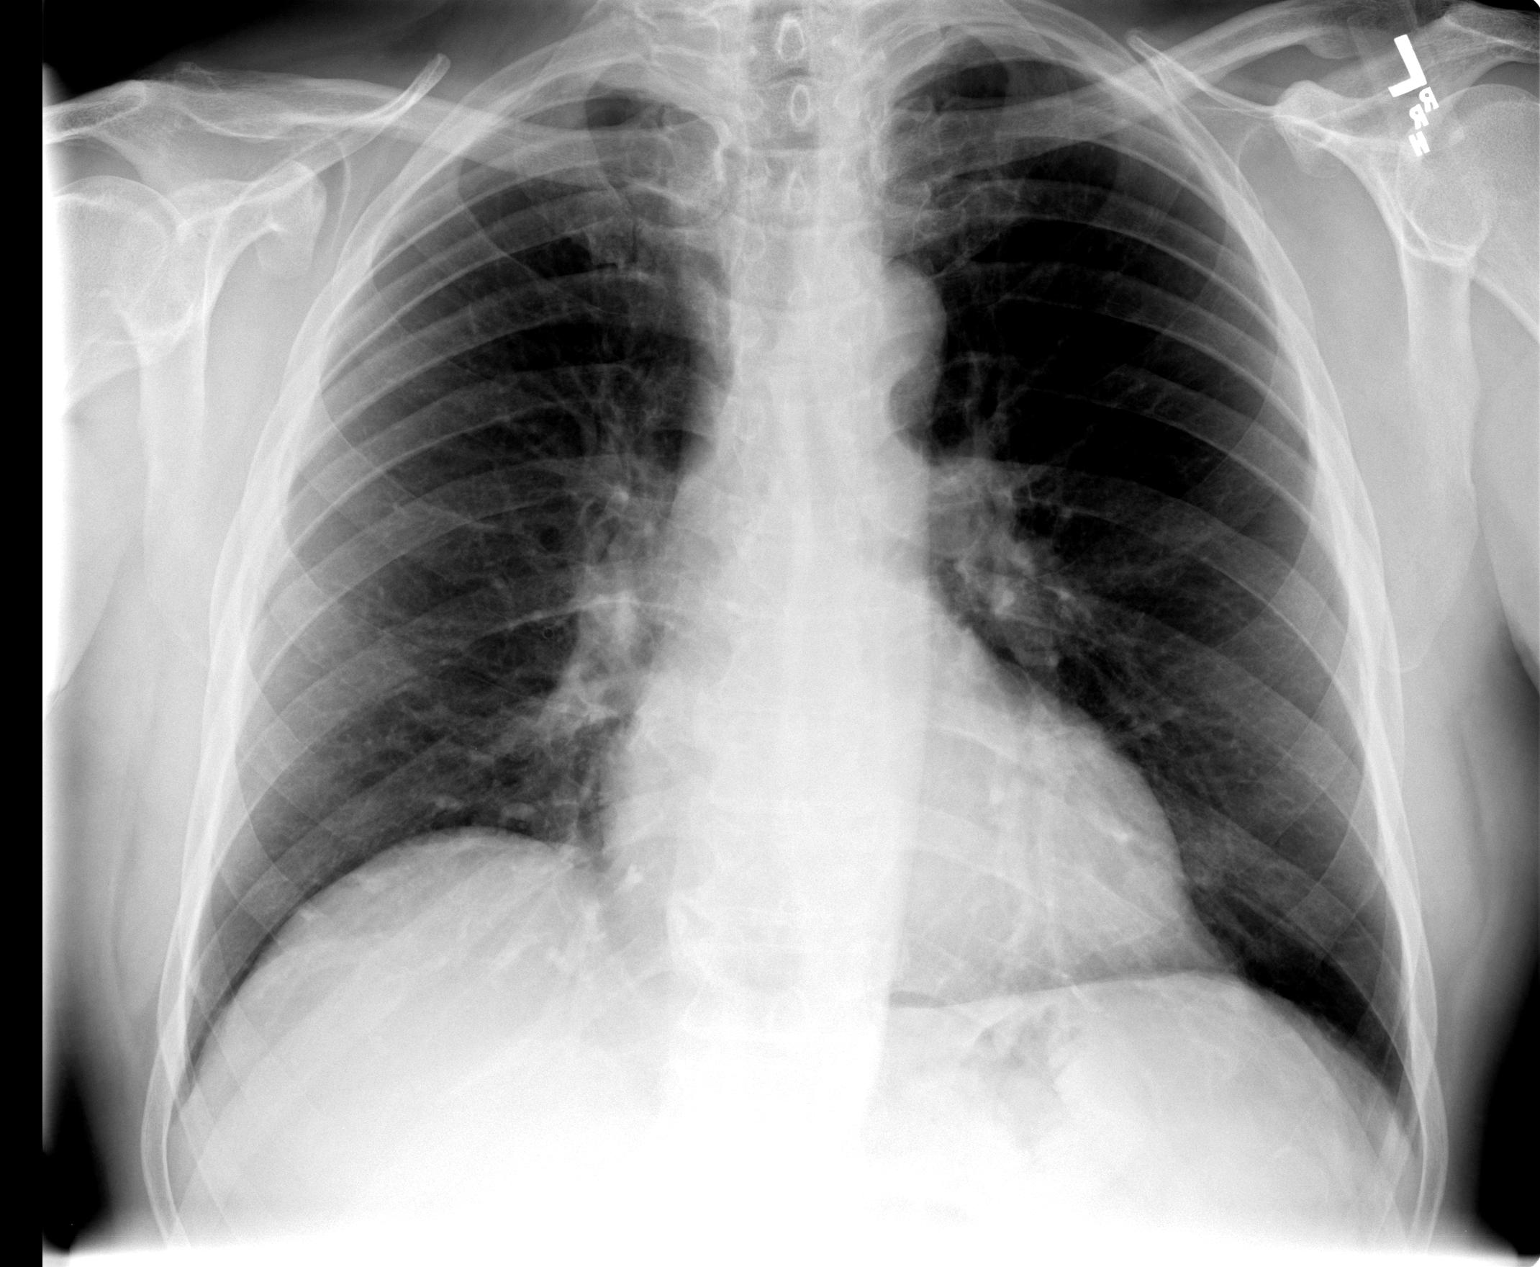

[view not recorded (2 of 2)]
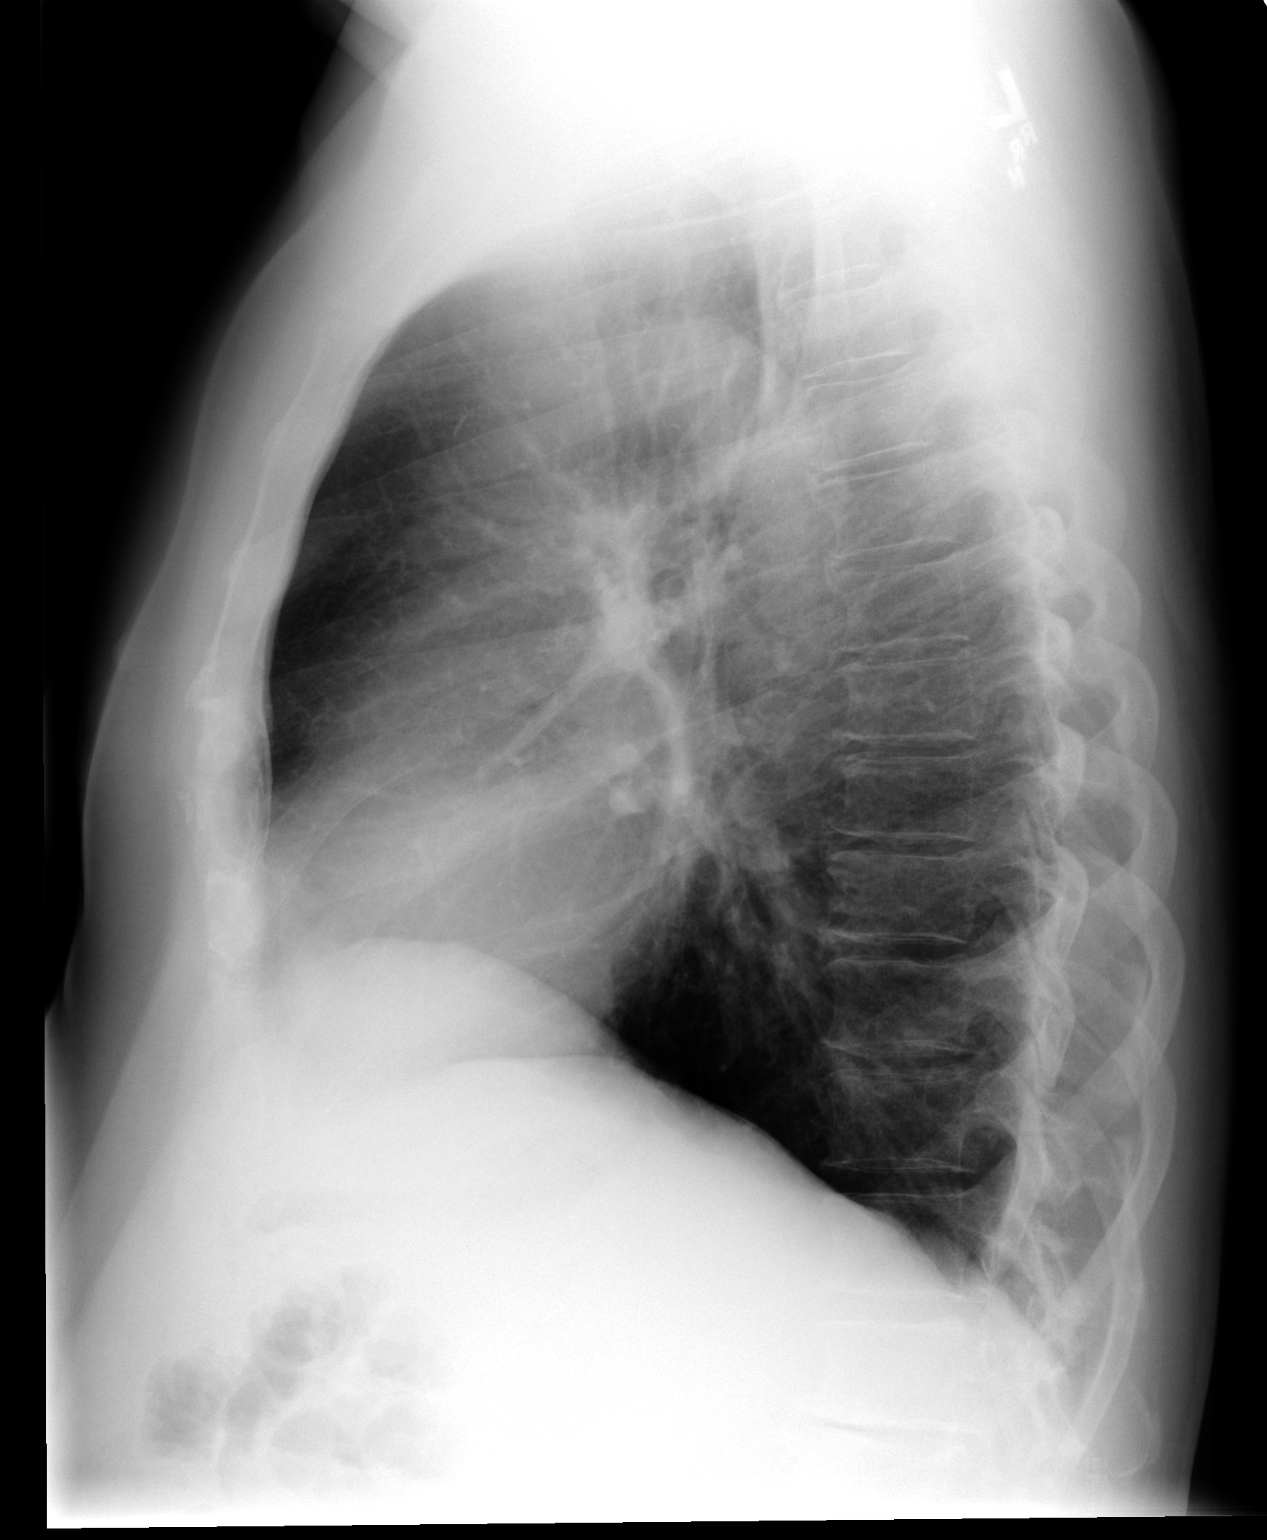

[2 of 2 positions shown; findings below may reference images not displayed]

FINDINGS: The lungs are borderline hyperinflated and clear. The heart and
mediastinal structures are normal. There is no pleural effusion. The
bony thorax is unremarkable.
IMPRESSION: There is no acute pneumonia nor CHF. There is mild hyperinflation
which may be voluntary or could reflect underlying COPD.

## 2015-05-20 DIAGNOSIS — M545 Low back pain: Secondary | ICD-10-CM | POA: Diagnosis not present

## 2015-05-20 DIAGNOSIS — M9902 Segmental and somatic dysfunction of thoracic region: Secondary | ICD-10-CM | POA: Diagnosis not present

## 2015-05-20 DIAGNOSIS — M9905 Segmental and somatic dysfunction of pelvic region: Secondary | ICD-10-CM | POA: Diagnosis not present

## 2015-05-20 DIAGNOSIS — M9903 Segmental and somatic dysfunction of lumbar region: Secondary | ICD-10-CM | POA: Diagnosis not present

## 2015-05-29 DIAGNOSIS — M25561 Pain in right knee: Secondary | ICD-10-CM | POA: Diagnosis not present

## 2015-05-29 DIAGNOSIS — M25562 Pain in left knee: Secondary | ICD-10-CM | POA: Diagnosis not present

## 2015-06-16 DIAGNOSIS — Z23 Encounter for immunization: Secondary | ICD-10-CM | POA: Diagnosis not present

## 2015-10-12 DIAGNOSIS — M9903 Segmental and somatic dysfunction of lumbar region: Secondary | ICD-10-CM | POA: Diagnosis not present

## 2015-10-12 DIAGNOSIS — M9905 Segmental and somatic dysfunction of pelvic region: Secondary | ICD-10-CM | POA: Diagnosis not present

## 2015-10-12 DIAGNOSIS — M9902 Segmental and somatic dysfunction of thoracic region: Secondary | ICD-10-CM | POA: Diagnosis not present

## 2015-10-12 DIAGNOSIS — M545 Low back pain: Secondary | ICD-10-CM | POA: Diagnosis not present

## 2015-10-16 DIAGNOSIS — M9905 Segmental and somatic dysfunction of pelvic region: Secondary | ICD-10-CM | POA: Diagnosis not present

## 2015-10-16 DIAGNOSIS — M9903 Segmental and somatic dysfunction of lumbar region: Secondary | ICD-10-CM | POA: Diagnosis not present

## 2015-10-16 DIAGNOSIS — M545 Low back pain: Secondary | ICD-10-CM | POA: Diagnosis not present

## 2015-10-16 DIAGNOSIS — M9902 Segmental and somatic dysfunction of thoracic region: Secondary | ICD-10-CM | POA: Diagnosis not present

## 2015-12-09 DIAGNOSIS — Z23 Encounter for immunization: Secondary | ICD-10-CM | POA: Diagnosis not present

## 2016-02-17 DIAGNOSIS — M5441 Lumbago with sciatica, right side: Secondary | ICD-10-CM | POA: Diagnosis not present

## 2016-03-11 DIAGNOSIS — M5137 Other intervertebral disc degeneration, lumbosacral region: Secondary | ICD-10-CM | POA: Diagnosis not present

## 2016-03-11 DIAGNOSIS — M5136 Other intervertebral disc degeneration, lumbar region: Secondary | ICD-10-CM | POA: Diagnosis not present

## 2016-03-11 DIAGNOSIS — M545 Low back pain: Secondary | ICD-10-CM | POA: Diagnosis not present

## 2016-05-25 DIAGNOSIS — Z23 Encounter for immunization: Secondary | ICD-10-CM | POA: Diagnosis not present

## 2016-09-17 DIAGNOSIS — M5136 Other intervertebral disc degeneration, lumbar region: Secondary | ICD-10-CM | POA: Diagnosis not present

## 2016-09-17 DIAGNOSIS — M5441 Lumbago with sciatica, right side: Secondary | ICD-10-CM | POA: Diagnosis not present

## 2016-09-27 DIAGNOSIS — M5136 Other intervertebral disc degeneration, lumbar region: Secondary | ICD-10-CM | POA: Diagnosis not present

## 2016-10-11 DIAGNOSIS — M5136 Other intervertebral disc degeneration, lumbar region: Secondary | ICD-10-CM | POA: Diagnosis not present

## 2016-10-11 DIAGNOSIS — M5441 Lumbago with sciatica, right side: Secondary | ICD-10-CM | POA: Diagnosis not present

## 2017-05-31 DIAGNOSIS — Z23 Encounter for immunization: Secondary | ICD-10-CM | POA: Diagnosis not present

## 2017-08-10 DIAGNOSIS — J069 Acute upper respiratory infection, unspecified: Secondary | ICD-10-CM | POA: Diagnosis not present

## 2017-08-10 DIAGNOSIS — J209 Acute bronchitis, unspecified: Secondary | ICD-10-CM | POA: Diagnosis not present

## 2017-11-16 DIAGNOSIS — K7030 Alcoholic cirrhosis of liver without ascites: Secondary | ICD-10-CM | POA: Diagnosis not present

## 2017-11-16 DIAGNOSIS — Z125 Encounter for screening for malignant neoplasm of prostate: Secondary | ICD-10-CM | POA: Diagnosis not present

## 2017-11-16 DIAGNOSIS — I10 Essential (primary) hypertension: Secondary | ICD-10-CM | POA: Diagnosis not present

## 2017-11-16 DIAGNOSIS — Z79899 Other long term (current) drug therapy: Secondary | ICD-10-CM | POA: Diagnosis not present

## 2017-11-16 DIAGNOSIS — F329 Major depressive disorder, single episode, unspecified: Secondary | ICD-10-CM | POA: Diagnosis not present

## 2017-11-23 ENCOUNTER — Other Ambulatory Visit (HOSPITAL_COMMUNITY): Payer: Self-pay | Admitting: Internal Medicine

## 2017-11-23 DIAGNOSIS — N401 Enlarged prostate with lower urinary tract symptoms: Secondary | ICD-10-CM | POA: Diagnosis not present

## 2017-11-23 DIAGNOSIS — K7030 Alcoholic cirrhosis of liver without ascites: Secondary | ICD-10-CM

## 2017-11-23 DIAGNOSIS — R001 Bradycardia, unspecified: Secondary | ICD-10-CM | POA: Diagnosis not present

## 2017-11-23 DIAGNOSIS — K7460 Unspecified cirrhosis of liver: Secondary | ICD-10-CM | POA: Diagnosis not present

## 2017-11-23 DIAGNOSIS — I10 Essential (primary) hypertension: Secondary | ICD-10-CM | POA: Diagnosis not present

## 2017-11-23 DIAGNOSIS — Z6828 Body mass index (BMI) 28.0-28.9, adult: Secondary | ICD-10-CM | POA: Diagnosis not present

## 2017-12-07 DIAGNOSIS — D040 Carcinoma in situ of skin of lip: Secondary | ICD-10-CM | POA: Diagnosis not present

## 2017-12-28 DIAGNOSIS — D040 Carcinoma in situ of skin of lip: Secondary | ICD-10-CM | POA: Diagnosis not present

## 2018-02-23 NOTE — Progress Notes (Signed)
Shane Landsberg, MD Reason for referral-atrial fibrillation  HPI: 77 year old male for evaluation of atrial fibrillation at request of Asencion Noble MD.  Previously followed by Dr. Ron Parker but not since 2015.  Apparently had a nuclear study in the remote past that showed potential inferior ischemia but was treated medically.  Echocardiogram in 2003 showed normal LV function.  Abdominal ultrasound October 2015 showed no aneurysm.  Also with apparent episode of atrial fibrillation years ago with no recurrence.  Patient denies dyspnea, chest pain, palpitations or syncope.  Current Outpatient Medications  Medication Sig Dispense Refill  . aspirin 81 MG tablet Take 81 mg by mouth daily.    Marland Kitchen buPROPion (ZYBAN) 150 MG 12 hr tablet Take 150 mg by mouth daily.    Marland Kitchen escitalopram (LEXAPRO) 20 MG tablet Take 20 mg by mouth daily.    Marland Kitchen losartan-hydrochlorothiazide (HYZAAR) 100-25 MG per tablet Take 1 tablet by mouth daily.    . pantoprazole (PROTONIX) 40 MG tablet Take 40 mg by mouth daily.    . Tamsulosin HCl (FLOMAX) 0.4 MG CAPS Take 0.4 mg by mouth daily.    Marland Kitchen zolpidem (AMBIEN) 10 MG tablet Take 5 mg by mouth at bedtime as needed. For sleep     No current facility-administered medications for this visit.     Allergies  Allergen Reactions  . Antihistamines, Chlorpheniramine-Type     Unable to take due to prostate.     Past Medical History:  Diagnosis Date  . ALCOHOL ABUSE 03/31/2007   Qualifier: Diagnosis of  By: Elveria Royals   . ALCOHOLIC HEPATITIS, HX OF 04/06/2007   Qualifier: Diagnosis of  By: Jenny Reichmann MD, [place] Oris   . ALLERGIC RHINITIS 04/06/2007   Qualifier: Diagnosis of  By: Jenny Reichmann MD, [place] ANEMIA-NOS 08/07/2007   Qualifier: Diagnosis of  By: Jenny Reichmann MD, Hunt Oris   . ANXIETY 03/31/2007   Qualifier: Diagnosis of  By: Elveria Royals   . Anxiety and depression   . ATRIAL FIBRILLATION, PAROXYSMAL, HX OF 03/31/2007   Paroxysmal episode of atrial fibrillation in the remote  past    . BENIGN PROSTATIC HYPERTROPHY 04/06/2007   Qualifier: Diagnosis of  By: Jenny Reichmann MD, [place] Oris   . 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 COLONIC POLYPS, HX OF 08/07/2007   Qualifier: Diagnosis of  By: Jenny Reichmann MD, Hunt Oris   . DEPRESSION 03/31/2007   Qualifier: Diagnosis of  By: Sherwood, [place], COLON 04/06/2007   Qualifier: Diagnosis of  By: Jenny Reichmann MD, [place], URINARY 08/07/2007   Qualifier: Diagnosis of  By: Jenny Reichmann MD, Hunt Oris   . GERD 08/07/2007   Qualifier: Diagnosis of  By: Jenny Reichmann MD, [place], INTERNAL 08/02/2007   Qualifier: Diagnosis of  By: Ronnald Ramp RN, [place], [place]    . Hypertension   . IBS 08/07/2007   Qualifier: Diagnosis of  By: Jenny Reichmann MD, [place], LEFT 03/31/2007   Qualifier: Diagnosis of  By: Sherwood, [place] 04/06/2007   Qualifier: Diagnosis of  By: Jenny Reichmann MD, Hunt Oris   . Personal history of urinary calculi 03/31/2007   Centricity Description: RENAL CALCULUS, HX OF Qualifier: Diagnosis of  By: Elveria Royals  Centricity Description: NEPHROLITHIASIS, HX OF Qualifier: Diagnosis of  By: Jenny Reichmann MD, Hunt Oris   . RASH-NONVESICULAR 08/07/2007   Qualifier: Diagnosis of  By: Jenny Reichmann MD, [place] BLEEDING 08/02/2007   Qualifier: Diagnosis of  By: Ronnald Ramp RN, [place], [place]    . THROMBOCYTOPENIA 04/06/2007   Qualifier: Diagnosis of  By: Jenny Reichmann MD, [place] Oris     Past Surgical History:  Procedure Laterality Date  . HERNIA REPAIR      Social History   Socioeconomic History  . Marital status: Married    Spouse name: Not on file  . Number of children: 2  . Years of education: Not on file  . Highest education level: Not on file  Occupational History  . Not on file  Social Needs  . Financial resource strain: Not on file    . Food insecurity:    Worry: Not on file    Inability: Not on file  . Transportation needs:    Medical: Not on file    Non-medical: Not on file  Tobacco Use  . Smoking status: Never Smoker  Substance and Sexual Activity  . Alcohol use: Yes    Comment: Occ  . Drug use: No  . Sexual activity: Not on file  Lifestyle  . Physical activity:    Days per week: Not on file    Minutes per session: Not on file  . Stress: Not on file  Relationships  . Social connections:    Talks on phone: Not on file    Gets together: Not on file    Attends religious service: Not on file    Active member of club or organization: Not on file    Attends meetings of clubs or organizations: Not on file    Relationship status: Not on file  . Intimate partner violence:    Fear of current or ex partner: Not on file    Emotionally abused: Not on file    Physically abused: Not on file    Forced sexual activity: Not on file  Other Topics Concern  . Not on file  Social History Narrative  . Not on file    Family History  Problem Relation Age of Onset  . Heart disease Mother   . Cancer Father     ROS: no fevers or chills, productive cough, hemoptysis, dysphasia, odynophagia, melena, hematochezia, dysuria, hematuria, rash, seizure activity, orthopnea, PND, pedal edema, claudication. Remaining systems are negative.  Physical Exam:   Blood pressure 126/78, pulse 68, height 5' 7.5" (1.715 m), weight 175 lb 12.8 oz (79.7 kg).  General:  Well developed/well nourished in NAD Skin warm/dry Patient not depressed No peripheral clubbing Back-normal HEENT-normal/normal eyelids Neck supple/normal carotid upstroke bilaterally; no bruits; no JVD; no thyromegaly chest - CTA/ normal expansion CV - RRR/normal S1 and S2; no murmurs, rubs or gallops;  PMI nondisplaced Abdomen -NT/ND, no HSM, no mass, + bowel sounds, no bruit 2+ femoral pulses, no bruits Ext-no edema, chords, 2+ DP Neuro-grossly nonfocal  ECG  -sinus rhythm with occasional PAC and PVC.  Cannot rule out prior septal infarct.  Personally reviewed  A/P  1 paroxysmal atrial fibrillation-patient has not had an episode of atrial fibrillation in years.  He states these episodes occur predominantly when he was drinking heavy amounts.  He is in sinus rhythm today. CHADSvasc 3.  However since he has not had an episode since the 90s I am inclined not to anticoagulate long-term  unless he has a recurrence.  2 hypertension-blood pressure is controlled.  Continue present medications and follow.  3 history of alcohol abuse-patient states he has decreased his use and I encouraged him to continue this.  Kirk Ruths, MD

## 2018-02-26 ENCOUNTER — Ambulatory Visit (HOSPITAL_COMMUNITY)
Admission: RE | Admit: 2018-02-26 | Discharge: 2018-02-26 | Disposition: A | Discharge status: Home / Self Care | Payer: Medicare Other | Source: Ambulatory Visit | Attending: Internal Medicine | Admitting: Internal Medicine

## 2018-02-26 DIAGNOSIS — K7030 Alcoholic cirrhosis of liver without ascites: Secondary | ICD-10-CM | POA: Insufficient documentation

## 2018-02-26 DIAGNOSIS — K7460 Unspecified cirrhosis of liver: Secondary | ICD-10-CM | POA: Diagnosis not present

## 2018-02-27 ENCOUNTER — Encounter: Payer: Self-pay | Admitting: Cardiology

## 2018-02-27 ENCOUNTER — Ambulatory Visit (INDEPENDENT_AMBULATORY_CARE_PROVIDER_SITE_OTHER): Payer: Medicare Other | Admitting: Cardiology

## 2018-02-27 VITALS — BP 126/78 | HR 68 | Ht 67.5 in | Wt 175.8 lb

## 2018-02-27 DIAGNOSIS — I10 Essential (primary) hypertension: Secondary | ICD-10-CM | POA: Diagnosis not present

## 2018-02-27 DIAGNOSIS — I480 Paroxysmal atrial fibrillation: Secondary | ICD-10-CM | POA: Diagnosis not present

## 2018-02-27 NOTE — Patient Instructions (Signed)
Your physician wants you to follow-up in: ONE YEAR WITH DR CRENSHAW You will receive a reminder letter in the mail two months in advance. If you don't receive a letter, please call our office to schedule the follow-up appointment.   If you need a refill on your cardiac medications before your next appointment, please call your pharmacy.  

## 2018-03-14 DIAGNOSIS — Z79899 Other long term (current) drug therapy: Secondary | ICD-10-CM | POA: Diagnosis not present

## 2018-03-14 DIAGNOSIS — I10 Essential (primary) hypertension: Secondary | ICD-10-CM | POA: Diagnosis not present

## 2018-03-14 DIAGNOSIS — K7030 Alcoholic cirrhosis of liver without ascites: Secondary | ICD-10-CM | POA: Diagnosis not present

## 2018-03-14 DIAGNOSIS — K219 Gastro-esophageal reflux disease without esophagitis: Secondary | ICD-10-CM | POA: Diagnosis not present

## 2018-03-21 DIAGNOSIS — K7460 Unspecified cirrhosis of liver: Secondary | ICD-10-CM | POA: Diagnosis not present

## 2018-03-21 DIAGNOSIS — N400 Enlarged prostate without lower urinary tract symptoms: Secondary | ICD-10-CM | POA: Diagnosis not present

## 2018-03-21 DIAGNOSIS — I10 Essential (primary) hypertension: Secondary | ICD-10-CM | POA: Diagnosis not present

## 2018-03-21 DIAGNOSIS — D696 Thrombocytopenia, unspecified: Secondary | ICD-10-CM | POA: Diagnosis not present

## 2018-04-16 DIAGNOSIS — S20361A Insect bite (nonvenomous) of right front wall of thorax, initial encounter: Secondary | ICD-10-CM | POA: Diagnosis not present

## 2018-04-16 DIAGNOSIS — S20362A Insect bite (nonvenomous) of left front wall of thorax, initial encounter: Secondary | ICD-10-CM | POA: Diagnosis not present

## 2018-07-01 DIAGNOSIS — Z23 Encounter for immunization: Secondary | ICD-10-CM | POA: Diagnosis not present

## 2018-07-17 DIAGNOSIS — Z79899 Other long term (current) drug therapy: Secondary | ICD-10-CM | POA: Diagnosis not present

## 2018-07-17 DIAGNOSIS — K7030 Alcoholic cirrhosis of liver without ascites: Secondary | ICD-10-CM | POA: Diagnosis not present

## 2018-07-17 DIAGNOSIS — I10 Essential (primary) hypertension: Secondary | ICD-10-CM | POA: Diagnosis not present

## 2018-07-24 DIAGNOSIS — I10 Essential (primary) hypertension: Secondary | ICD-10-CM | POA: Diagnosis not present

## 2018-07-24 DIAGNOSIS — K7460 Unspecified cirrhosis of liver: Secondary | ICD-10-CM | POA: Diagnosis not present

## 2018-07-26 DIAGNOSIS — H25811 Combined forms of age-related cataract, right eye: Secondary | ICD-10-CM | POA: Diagnosis not present

## 2018-07-26 DIAGNOSIS — H353132 Nonexudative age-related macular degeneration, bilateral, intermediate dry stage: Secondary | ICD-10-CM | POA: Diagnosis not present

## 2018-07-26 DIAGNOSIS — H26492 Other secondary cataract, left eye: Secondary | ICD-10-CM | POA: Diagnosis not present

## 2018-07-26 DIAGNOSIS — H2181 Floppy iris syndrome: Secondary | ICD-10-CM | POA: Diagnosis not present

## 2018-07-26 DIAGNOSIS — H35373 Puckering of macula, bilateral: Secondary | ICD-10-CM | POA: Diagnosis not present

## 2018-07-26 DIAGNOSIS — Z961 Presence of intraocular lens: Secondary | ICD-10-CM | POA: Diagnosis not present

## 2018-07-26 DIAGNOSIS — H5703 Miosis: Secondary | ICD-10-CM | POA: Diagnosis not present

## 2018-07-30 DIAGNOSIS — H353111 Nonexudative age-related macular degeneration, right eye, early dry stage: Secondary | ICD-10-CM | POA: Diagnosis not present

## 2018-07-30 DIAGNOSIS — H2511 Age-related nuclear cataract, right eye: Secondary | ICD-10-CM | POA: Diagnosis not present

## 2018-07-30 DIAGNOSIS — H353122 Nonexudative age-related macular degeneration, left eye, intermediate dry stage: Secondary | ICD-10-CM | POA: Diagnosis not present

## 2018-07-30 DIAGNOSIS — H35372 Puckering of macula, left eye: Secondary | ICD-10-CM | POA: Diagnosis not present

## 2018-07-30 DIAGNOSIS — H35371 Puckering of macula, right eye: Secondary | ICD-10-CM | POA: Diagnosis not present

## 2018-08-15 DIAGNOSIS — H35372 Puckering of macula, left eye: Secondary | ICD-10-CM | POA: Diagnosis not present

## 2018-08-22 DIAGNOSIS — Z09 Encounter for follow-up examination after completed treatment for conditions other than malignant neoplasm: Secondary | ICD-10-CM | POA: Diagnosis not present

## 2018-08-22 DIAGNOSIS — H35372 Puckering of macula, left eye: Secondary | ICD-10-CM | POA: Diagnosis not present

## 2018-10-03 DIAGNOSIS — M48061 Spinal stenosis, lumbar region without neurogenic claudication: Secondary | ICD-10-CM | POA: Diagnosis not present

## 2018-10-03 DIAGNOSIS — M545 Low back pain: Secondary | ICD-10-CM | POA: Diagnosis not present

## 2018-10-23 DIAGNOSIS — K7460 Unspecified cirrhosis of liver: Secondary | ICD-10-CM | POA: Diagnosis not present

## 2018-10-23 DIAGNOSIS — I10 Essential (primary) hypertension: Secondary | ICD-10-CM | POA: Diagnosis not present

## 2019-01-09 DIAGNOSIS — H35373 Puckering of macula, bilateral: Secondary | ICD-10-CM | POA: Diagnosis not present

## 2019-01-09 DIAGNOSIS — H25811 Combined forms of age-related cataract, right eye: Secondary | ICD-10-CM | POA: Diagnosis not present

## 2019-01-09 DIAGNOSIS — H26492 Other secondary cataract, left eye: Secondary | ICD-10-CM | POA: Diagnosis not present

## 2019-01-09 DIAGNOSIS — H2181 Floppy iris syndrome: Secondary | ICD-10-CM | POA: Diagnosis not present

## 2019-01-09 DIAGNOSIS — H353132 Nonexudative age-related macular degeneration, bilateral, intermediate dry stage: Secondary | ICD-10-CM | POA: Diagnosis not present

## 2019-01-09 DIAGNOSIS — Z961 Presence of intraocular lens: Secondary | ICD-10-CM | POA: Diagnosis not present

## 2019-01-09 DIAGNOSIS — H5703 Miosis: Secondary | ICD-10-CM | POA: Diagnosis not present

## 2019-01-14 DIAGNOSIS — H2511 Age-related nuclear cataract, right eye: Secondary | ICD-10-CM | POA: Diagnosis not present

## 2019-01-21 DIAGNOSIS — H21561 Pupillary abnormality, right eye: Secondary | ICD-10-CM | POA: Diagnosis not present

## 2019-01-21 DIAGNOSIS — H25011 Cortical age-related cataract, right eye: Secondary | ICD-10-CM | POA: Diagnosis not present

## 2019-01-21 DIAGNOSIS — H2511 Age-related nuclear cataract, right eye: Secondary | ICD-10-CM | POA: Diagnosis not present

## 2019-01-21 DIAGNOSIS — H25811 Combined forms of age-related cataract, right eye: Secondary | ICD-10-CM | POA: Diagnosis not present

## 2019-02-11 ENCOUNTER — Telehealth: Payer: Self-pay | Admitting: *Deleted

## 2019-02-11 NOTE — Telephone Encounter (Signed)
A message was left, re: follow up visit. 

## 2019-02-19 DIAGNOSIS — K7030 Alcoholic cirrhosis of liver without ascites: Secondary | ICD-10-CM | POA: Diagnosis not present

## 2019-02-19 DIAGNOSIS — Z79899 Other long term (current) drug therapy: Secondary | ICD-10-CM | POA: Diagnosis not present

## 2019-02-19 DIAGNOSIS — Z125 Encounter for screening for malignant neoplasm of prostate: Secondary | ICD-10-CM | POA: Diagnosis not present

## 2019-02-19 DIAGNOSIS — I10 Essential (primary) hypertension: Secondary | ICD-10-CM | POA: Diagnosis not present

## 2019-02-19 DIAGNOSIS — F329 Major depressive disorder, single episode, unspecified: Secondary | ICD-10-CM | POA: Diagnosis not present

## 2019-02-19 DIAGNOSIS — I482 Chronic atrial fibrillation, unspecified: Secondary | ICD-10-CM | POA: Diagnosis not present

## 2019-02-26 ENCOUNTER — Other Ambulatory Visit (HOSPITAL_COMMUNITY): Payer: Self-pay | Admitting: Internal Medicine

## 2019-02-26 ENCOUNTER — Other Ambulatory Visit: Payer: Self-pay | Admitting: Internal Medicine

## 2019-02-26 DIAGNOSIS — F3340 Major depressive disorder, recurrent, in remission, unspecified: Secondary | ICD-10-CM | POA: Diagnosis not present

## 2019-02-26 DIAGNOSIS — I10 Essential (primary) hypertension: Secondary | ICD-10-CM | POA: Diagnosis not present

## 2019-02-26 DIAGNOSIS — K7460 Unspecified cirrhosis of liver: Secondary | ICD-10-CM | POA: Diagnosis not present

## 2019-02-26 DIAGNOSIS — I480 Paroxysmal atrial fibrillation: Secondary | ICD-10-CM | POA: Diagnosis not present

## 2019-03-01 ENCOUNTER — Ambulatory Visit (HOSPITAL_COMMUNITY)
Admission: RE | Admit: 2019-03-01 | Discharge: 2019-03-01 | Disposition: A | Discharge status: Home / Self Care | Payer: Medicare Other | Source: Ambulatory Visit | Attending: Internal Medicine | Admitting: Internal Medicine

## 2019-03-01 ENCOUNTER — Other Ambulatory Visit: Payer: Self-pay

## 2019-03-01 DIAGNOSIS — K7460 Unspecified cirrhosis of liver: Secondary | ICD-10-CM | POA: Diagnosis not present

## 2019-03-01 DIAGNOSIS — K824 Cholesterolosis of gallbladder: Secondary | ICD-10-CM | POA: Diagnosis not present

## 2019-03-06 DIAGNOSIS — Z961 Presence of intraocular lens: Secondary | ICD-10-CM | POA: Diagnosis not present

## 2019-03-06 DIAGNOSIS — H26492 Other secondary cataract, left eye: Secondary | ICD-10-CM | POA: Diagnosis not present

## 2019-04-29 DIAGNOSIS — Z23 Encounter for immunization: Secondary | ICD-10-CM | POA: Diagnosis not present

## 2019-05-15 ENCOUNTER — Encounter: Payer: Self-pay | Admitting: *Deleted

## 2019-05-15 DIAGNOSIS — L918 Other hypertrophic disorders of the skin: Secondary | ICD-10-CM | POA: Diagnosis not present

## 2019-05-15 DIAGNOSIS — B0089 Other herpesviral infection: Secondary | ICD-10-CM | POA: Diagnosis not present

## 2019-05-15 DIAGNOSIS — D225 Melanocytic nevi of trunk: Secondary | ICD-10-CM | POA: Diagnosis not present

## 2019-05-16 DIAGNOSIS — M5136 Other intervertebral disc degeneration, lumbar region: Secondary | ICD-10-CM | POA: Diagnosis not present

## 2019-05-21 ENCOUNTER — Ambulatory Visit: Payer: BLUE CROSS/BLUE SHIELD | Admitting: Cardiology

## 2019-05-30 DIAGNOSIS — M1711 Unilateral primary osteoarthritis, right knee: Secondary | ICD-10-CM | POA: Diagnosis not present

## 2019-05-30 DIAGNOSIS — M5136 Other intervertebral disc degeneration, lumbar region: Secondary | ICD-10-CM | POA: Diagnosis not present

## 2019-05-30 DIAGNOSIS — M1712 Unilateral primary osteoarthritis, left knee: Secondary | ICD-10-CM | POA: Diagnosis not present

## 2019-06-27 DIAGNOSIS — K7460 Unspecified cirrhosis of liver: Secondary | ICD-10-CM | POA: Diagnosis not present

## 2019-06-27 DIAGNOSIS — D696 Thrombocytopenia, unspecified: Secondary | ICD-10-CM | POA: Diagnosis not present

## 2019-06-27 DIAGNOSIS — Z79899 Other long term (current) drug therapy: Secondary | ICD-10-CM | POA: Diagnosis not present

## 2019-06-27 DIAGNOSIS — K219 Gastro-esophageal reflux disease without esophagitis: Secondary | ICD-10-CM | POA: Diagnosis not present

## 2019-06-27 DIAGNOSIS — I10 Essential (primary) hypertension: Secondary | ICD-10-CM | POA: Diagnosis not present

## 2019-06-27 DIAGNOSIS — G4700 Insomnia, unspecified: Secondary | ICD-10-CM | POA: Diagnosis not present

## 2019-06-27 DIAGNOSIS — F329 Major depressive disorder, single episode, unspecified: Secondary | ICD-10-CM | POA: Diagnosis not present

## 2019-07-30 NOTE — Progress Notes (Signed)
HPI: FU atrial fibrillation. Apparently had a nuclear study in the remote past that showed potential inferior ischemia but was treated medically.  Echocardiogram in 2003 showed normal LV function.  Abdominal ultrasound October 2015 showed no aneurysm.  Also with apparent episode of atrial fibrillation years ago with no recurrence. Since last seen, patient denies dyspnea, chest pain or syncope.  Occasional brief skip but no sustained palpitations.  Current Outpatient Medications  Medication Sig Dispense Refill  . aspirin 81 MG tablet Take 81 mg by mouth daily.    Marland Kitchen escitalopram (LEXAPRO) 20 MG tablet Take 20 mg by mouth daily.    Marland Kitchen losartan-hydrochlorothiazide (HYZAAR) 100-25 MG per tablet Take 1 tablet by mouth daily.    . pantoprazole (PROTONIX) 40 MG tablet Take 40 mg by mouth daily.    . Tamsulosin HCl (FLOMAX) 0.4 MG CAPS Take 0.4 mg by mouth daily.    Marland Kitchen zolpidem (AMBIEN) 10 MG tablet Take 5 mg by mouth at bedtime as needed. For sleep     No current facility-administered medications for this visit.     Past Medical History:  Diagnosis Date  . ALCOHOL ABUSE 03/31/2007   Qualifier: Diagnosis of  By: Elveria Royals   . ALCOHOLIC HEPATITIS, HX OF 04/06/2007   Qualifier: Diagnosis of  By: Jenny Reichmann MD, Hunt Oris   . ALLERGIC RHINITIS 04/06/2007   Qualifier: Diagnosis of  By: Jenny Reichmann MD, [place] ANEMIA-NOS 08/07/2007   Qualifier: Diagnosis of  By: Jenny Reichmann MD, Hunt Oris   . ANXIETY 03/31/2007   Qualifier: Diagnosis of  By: Elveria Royals   . Anxiety and depression   . ATRIAL FIBRILLATION, PAROXYSMAL, HX OF 03/31/2007   Paroxysmal episode of atrial fibrillation in the remote past    . BENIGN PROSTATIC HYPERTROPHY 04/06/2007   Qualifier: Diagnosis of  By: Jenny Reichmann MD, Hunt Oris   . 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 COLONIC POLYPS, ADENOMATOUS 08/02/2007   Qualifier: Diagnosis of  By: Ronnald Ramp RN, CGRN, Sheri     . COLONIC POLYPS, HX OF 08/07/2007   Qualifier: Diagnosis of  By: Jenny Reichmann MD, Hunt Oris   . DEPRESSION 03/31/2007   Qualifier: Diagnosis of  By: [place], [place], COLON 04/06/2007   Qualifier: Diagnosis of  By: Jenny Reichmann MD, [place], URINARY 08/07/2007   Qualifier: Diagnosis of  By: Jenny Reichmann MD, Hunt Oris   . GERD 08/07/2007   Qualifier: Diagnosis of  By: Jenny Reichmann MD, [place], INTERNAL 08/02/2007   Qualifier: Diagnosis of  By: Ronnald Ramp RN, [place], [place]    . Hypertension   . IBS 08/07/2007   Qualifier: Diagnosis of  By: Jenny Reichmann MD, [place], LEFT 03/31/2007   Qualifier: Diagnosis of  By: [place], [place] 04/06/2007   Qualifier: Diagnosis of  By: Jenny Reichmann MD, Hunt Oris   . Personal history of urinary calculi 03/31/2007   Centricity Description: RENAL CALCULUS, HX OF Qualifier: Diagnosis of  By: Elveria Royals  Centricity Description: NEPHROLITHIASIS, HX OF Qualifier: Diagnosis of  By: Jenny Reichmann MD, Hunt Oris   . RASH-NONVESICULAR 08/07/2007   Qualifier: Diagnosis of  By: Jenny Reichmann MD, [place] BLEEDING 08/02/2007   Qualifier: Diagnosis of  By: Ronnald Ramp RN, [place], [place]    . THROMBOCYTOPENIA  04/06/2007   Qualifier: Diagnosis of  By: Jenny Reichmann MD, Hunt Oris     Past Surgical History:  Procedure Laterality Date  . HERNIA REPAIR      Social History   Socioeconomic History  . Marital status: Married    Spouse name: Not on file  . Number of children: 2  . Years of education: Not on file  . Highest education level: Not on file  Occupational History  . Not on file  Tobacco Use  . Smoking status: Never Smoker  . Smokeless tobacco: Never Used  Substance and Sexual Activity  . Alcohol use: Yes    Comment: Occ  . Drug use: No  . Sexual activity: Not on file  Other Topics Concern  . Not on file  Social History Narrative  . Not on file   Social Determinants of Health   Financial Resource Strain:   . Difficulty of  Paying Living Expenses: Not on file  Food Insecurity:   . Worried About Charity fundraiser in the Last Year: Not on file  . Ran Out of Food in the Last Year: Not on file  Transportation Needs:   . Lack of Transportation (Medical): Not on file  . Lack of Transportation (Non-Medical): Not on file  Physical Activity:   . Days of Exercise per Week: Not on file  . Minutes of Exercise per Session: Not on file  Stress:   . Feeling of Stress : Not on file  Social Connections:   . Frequency of Communication with Friends and Family: Not on file  . Frequency of Social Gatherings with Friends and Family: Not on file  . Attends Religious Services: Not on file  . Active Member of Clubs or Organizations: Not on file  . Attends Archivist Meetings: Not on file  . Marital Status: Not on file  Intimate Partner Violence:   . Fear of Current or Ex-Partner: Not on file  . Emotionally Abused: Not on file  . Physically Abused: Not on file  . Sexually Abused: Not on file    Family History  Problem Relation Age of Onset  . Heart disease Mother   . Cancer Father     ROS: no fevers or chills, productive cough, hemoptysis, dysphasia, odynophagia, melena, hematochezia, dysuria, hematuria, rash, seizure activity, orthopnea, PND, pedal edema, claudication. Remaining systems are negative.  Physical Exam: Well-developed well-nourished in no acute distress.  Skin is warm and dry.  HEENT is normal.  Neck is supple.  Chest is clear to auscultation with normal expansion.  Cardiovascular exam is regular rate and rhythm.  Abdominal exam nontender or distended. No masses palpated. Extremities show no edema. neuro grossly intact  ECG-sinus rhythm with occasional PAC, cannot rule out prior septal infarct.  Personally reviewed  A/P  1 paroxysmal atrial fibrillation-patient apparently had an episode of atrial fibrillation years ago with no recurrences.  This occurred when he was drinking heavily.  He  continues to be in sinus rhythm and we have therefore not anticoagulated.  We will reconsider if he has recurrences in the future.  2 hypertension-blood pressure elevated; I have asked him to follow this and we will add additional medications as needed.  3 history of alcohol abuse-we discussed the importance of avoiding.  Kirk Ruths, MD

## 2019-08-06 ENCOUNTER — Other Ambulatory Visit: Payer: Self-pay

## 2019-08-06 ENCOUNTER — Ambulatory Visit (INDEPENDENT_AMBULATORY_CARE_PROVIDER_SITE_OTHER): Payer: Medicare Other | Admitting: Cardiology

## 2019-08-06 ENCOUNTER — Encounter: Payer: Self-pay | Admitting: Cardiology

## 2019-08-06 VITALS — BP 150/70 | HR 76 | Ht 68.0 in | Wt 174.8 lb

## 2019-08-06 DIAGNOSIS — I480 Paroxysmal atrial fibrillation: Secondary | ICD-10-CM

## 2019-08-06 DIAGNOSIS — I10 Essential (primary) hypertension: Secondary | ICD-10-CM | POA: Diagnosis not present

## 2019-08-06 NOTE — Patient Instructions (Signed)

## 2019-08-22 DIAGNOSIS — Z23 Encounter for immunization: Secondary | ICD-10-CM | POA: Diagnosis not present

## 2019-09-20 DIAGNOSIS — Z20828 Contact with and (suspected) exposure to other viral communicable diseases: Secondary | ICD-10-CM | POA: Diagnosis not present

## 2019-09-20 DIAGNOSIS — Z03818 Encounter for observation for suspected exposure to other biological agents ruled out: Secondary | ICD-10-CM | POA: Diagnosis not present

## 2019-09-27 DIAGNOSIS — Z23 Encounter for immunization: Secondary | ICD-10-CM | POA: Diagnosis not present

## 2019-11-04 DIAGNOSIS — I480 Paroxysmal atrial fibrillation: Secondary | ICD-10-CM | POA: Diagnosis not present

## 2019-11-04 DIAGNOSIS — K7030 Alcoholic cirrhosis of liver without ascites: Secondary | ICD-10-CM | POA: Diagnosis not present

## 2019-11-04 DIAGNOSIS — I10 Essential (primary) hypertension: Secondary | ICD-10-CM | POA: Diagnosis not present

## 2019-11-04 DIAGNOSIS — Z79899 Other long term (current) drug therapy: Secondary | ICD-10-CM | POA: Diagnosis not present

## 2019-11-25 DIAGNOSIS — R2233 Localized swelling, mass and lump, upper limb, bilateral: Secondary | ICD-10-CM | POA: Diagnosis not present

## 2019-12-02 DIAGNOSIS — R2233 Localized swelling, mass and lump, upper limb, bilateral: Secondary | ICD-10-CM | POA: Diagnosis not present

## 2019-12-18 DIAGNOSIS — M2550 Pain in unspecified joint: Secondary | ICD-10-CM | POA: Diagnosis not present

## 2019-12-18 DIAGNOSIS — Z6829 Body mass index (BMI) 29.0-29.9, adult: Secondary | ICD-10-CM | POA: Diagnosis not present

## 2019-12-18 DIAGNOSIS — I10 Essential (primary) hypertension: Secondary | ICD-10-CM | POA: Diagnosis not present

## 2019-12-18 DIAGNOSIS — D696 Thrombocytopenia, unspecified: Secondary | ICD-10-CM | POA: Diagnosis not present

## 2019-12-18 DIAGNOSIS — K7460 Unspecified cirrhosis of liver: Secondary | ICD-10-CM | POA: Diagnosis not present

## 2020-02-06 DIAGNOSIS — H2181 Floppy iris syndrome: Secondary | ICD-10-CM | POA: Diagnosis not present

## 2020-02-06 DIAGNOSIS — Z9889 Other specified postprocedural states: Secondary | ICD-10-CM | POA: Diagnosis not present

## 2020-02-06 DIAGNOSIS — H5703 Miosis: Secondary | ICD-10-CM | POA: Diagnosis not present

## 2020-02-06 DIAGNOSIS — H35371 Puckering of macula, right eye: Secondary | ICD-10-CM | POA: Diagnosis not present

## 2020-02-06 DIAGNOSIS — H26491 Other secondary cataract, right eye: Secondary | ICD-10-CM | POA: Diagnosis not present

## 2020-02-06 DIAGNOSIS — H353132 Nonexudative age-related macular degeneration, bilateral, intermediate dry stage: Secondary | ICD-10-CM | POA: Diagnosis not present

## 2020-02-06 DIAGNOSIS — Z961 Presence of intraocular lens: Secondary | ICD-10-CM | POA: Diagnosis not present

## 2020-02-14 DIAGNOSIS — M180 Bilateral primary osteoarthritis of first carpometacarpal joints: Secondary | ICD-10-CM | POA: Diagnosis not present

## 2020-02-14 DIAGNOSIS — M7022 Olecranon bursitis, left elbow: Secondary | ICD-10-CM | POA: Diagnosis not present

## 2020-02-14 DIAGNOSIS — M19042 Primary osteoarthritis, left hand: Secondary | ICD-10-CM | POA: Diagnosis not present

## 2020-02-14 DIAGNOSIS — M19041 Primary osteoarthritis, right hand: Secondary | ICD-10-CM | POA: Diagnosis not present

## 2020-02-14 DIAGNOSIS — R52 Pain, unspecified: Secondary | ICD-10-CM | POA: Diagnosis not present

## 2020-02-27 DIAGNOSIS — D696 Thrombocytopenia, unspecified: Secondary | ICD-10-CM | POA: Diagnosis not present

## 2020-02-27 DIAGNOSIS — Z125 Encounter for screening for malignant neoplasm of prostate: Secondary | ICD-10-CM | POA: Diagnosis not present

## 2020-02-27 DIAGNOSIS — K219 Gastro-esophageal reflux disease without esophagitis: Secondary | ICD-10-CM | POA: Diagnosis not present

## 2020-02-27 DIAGNOSIS — I480 Paroxysmal atrial fibrillation: Secondary | ICD-10-CM | POA: Diagnosis not present

## 2020-02-27 DIAGNOSIS — Z79899 Other long term (current) drug therapy: Secondary | ICD-10-CM | POA: Diagnosis not present

## 2020-02-27 DIAGNOSIS — F329 Major depressive disorder, single episode, unspecified: Secondary | ICD-10-CM | POA: Diagnosis not present

## 2020-02-27 DIAGNOSIS — G4700 Insomnia, unspecified: Secondary | ICD-10-CM | POA: Diagnosis not present

## 2020-02-27 DIAGNOSIS — K7030 Alcoholic cirrhosis of liver without ascites: Secondary | ICD-10-CM | POA: Diagnosis not present

## 2020-03-05 DIAGNOSIS — I10 Essential (primary) hypertension: Secondary | ICD-10-CM | POA: Diagnosis not present

## 2020-03-05 DIAGNOSIS — K7460 Unspecified cirrhosis of liver: Secondary | ICD-10-CM | POA: Diagnosis not present

## 2020-03-05 DIAGNOSIS — I482 Chronic atrial fibrillation, unspecified: Secondary | ICD-10-CM | POA: Diagnosis not present

## 2020-03-05 DIAGNOSIS — Z0001 Encounter for general adult medical examination with abnormal findings: Secondary | ICD-10-CM | POA: Diagnosis not present

## 2020-03-05 DIAGNOSIS — D696 Thrombocytopenia, unspecified: Secondary | ICD-10-CM | POA: Diagnosis not present

## 2020-03-09 ENCOUNTER — Other Ambulatory Visit: Payer: Self-pay | Admitting: Internal Medicine

## 2020-03-09 ENCOUNTER — Other Ambulatory Visit (HOSPITAL_COMMUNITY): Payer: Self-pay | Admitting: Internal Medicine

## 2020-03-09 DIAGNOSIS — K7030 Alcoholic cirrhosis of liver without ascites: Secondary | ICD-10-CM

## 2020-03-13 ENCOUNTER — Ambulatory Visit (HOSPITAL_COMMUNITY)
Admission: RE | Admit: 2020-03-13 | Discharge: 2020-03-13 | Disposition: A | Discharge status: Home / Self Care | Payer: Medicare Other | Source: Ambulatory Visit | Attending: Internal Medicine | Admitting: Internal Medicine

## 2020-03-13 ENCOUNTER — Other Ambulatory Visit: Payer: Self-pay

## 2020-03-13 DIAGNOSIS — K7030 Alcoholic cirrhosis of liver without ascites: Secondary | ICD-10-CM | POA: Insufficient documentation

## 2020-03-13 DIAGNOSIS — K824 Cholesterolosis of gallbladder: Secondary | ICD-10-CM | POA: Diagnosis not present

## 2020-03-13 DIAGNOSIS — K7460 Unspecified cirrhosis of liver: Secondary | ICD-10-CM | POA: Diagnosis not present

## 2020-04-28 DIAGNOSIS — H353221 Exudative age-related macular degeneration, left eye, with active choroidal neovascularization: Secondary | ICD-10-CM | POA: Diagnosis not present

## 2020-04-29 DIAGNOSIS — Z23 Encounter for immunization: Secondary | ICD-10-CM | POA: Diagnosis not present

## 2020-04-29 DIAGNOSIS — I10 Essential (primary) hypertension: Secondary | ICD-10-CM | POA: Diagnosis not present

## 2020-05-13 ENCOUNTER — Encounter (INDEPENDENT_AMBULATORY_CARE_PROVIDER_SITE_OTHER): Payer: Medicare Other | Admitting: Ophthalmology

## 2020-05-19 DIAGNOSIS — H35371 Puckering of macula, right eye: Secondary | ICD-10-CM | POA: Diagnosis not present

## 2020-05-19 DIAGNOSIS — H353124 Nonexudative age-related macular degeneration, left eye, advanced atrophic with subfoveal involvement: Secondary | ICD-10-CM | POA: Diagnosis not present

## 2020-05-19 DIAGNOSIS — H353112 Nonexudative age-related macular degeneration, right eye, intermediate dry stage: Secondary | ICD-10-CM | POA: Diagnosis not present

## 2020-09-18 DIAGNOSIS — M25561 Pain in right knee: Secondary | ICD-10-CM | POA: Diagnosis not present

## 2020-09-18 DIAGNOSIS — M1711 Unilateral primary osteoarthritis, right knee: Secondary | ICD-10-CM | POA: Diagnosis not present

## 2020-09-18 DIAGNOSIS — M170 Bilateral primary osteoarthritis of knee: Secondary | ICD-10-CM | POA: Diagnosis not present

## 2020-09-18 DIAGNOSIS — M25562 Pain in left knee: Secondary | ICD-10-CM | POA: Diagnosis not present

## 2020-09-18 DIAGNOSIS — M1712 Unilateral primary osteoarthritis, left knee: Secondary | ICD-10-CM | POA: Diagnosis not present

## 2020-10-21 NOTE — Progress Notes (Signed)
HPI: FU atrial fibrillation. Apparently had a nuclear study in the remote past that showed potential inferior ischemia but was treated medically. Echocardiogram in 2003 showed normal LV function. Abdominal ultrasoundOctober 2015 showed no aneurysm. Also with apparent episode of atrial fibrillation years ago with no recurrence. Since last seen,  patient denies dyspnea, chest pain or syncope.  Occasional brief skipped but no sustained palpitations.  Current Outpatient Medications  Medication Sig Dispense Refill  . aspirin 81 MG tablet Take 81 mg by mouth daily.    Marland Kitchen escitalopram (LEXAPRO) 20 MG tablet Take 20 mg by mouth daily.    Marland Kitchen losartan-hydrochlorothiazide (HYZAAR) 100-25 MG per tablet Take 1 tablet by mouth daily.    . Multiple Vitamins-Minerals (PRESERVISION AREDS 2+MULTI VIT PO) Take by mouth daily. 1 Tablet Daily    . pantoprazole (PROTONIX) 40 MG tablet Take 40 mg by mouth daily.    . Tamsulosin HCl (FLOMAX) 0.4 MG CAPS Take 0.4 mg by mouth daily.    Marland Kitchen zolpidem (AMBIEN) 10 MG tablet Take 5 mg by mouth at bedtime as needed. For sleep     No current facility-administered medications for this visit.     Past Medical History:  Diagnosis Date  . ALCOHOL ABUSE 03/31/2007   Qualifier: Diagnosis of  By: Elveria Royals   . ALCOHOLIC HEPATITIS, HX OF 04/06/2007   Qualifier: Diagnosis of  By: Jenny Reichmann MD, Hunt Oris   . ALLERGIC RHINITIS 04/06/2007   Qualifier: Diagnosis of  By: Jenny Reichmann MD, [place] ANEMIA-NOS 08/07/2007   Qualifier: Diagnosis of  By: Jenny Reichmann MD, Hunt Oris   . ANXIETY 03/31/2007   Qualifier: Diagnosis of  By: Elveria Royals   . Anxiety and depression   . ATRIAL FIBRILLATION, PAROXYSMAL, HX OF 03/31/2007   Paroxysmal episode of atrial fibrillation in the remote past    . BENIGN PROSTATIC HYPERTROPHY 04/06/2007   Qualifier: Diagnosis of  By: Jenny Reichmann MD, Hunt Oris   . 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 COLONIC POLYPS, HX OF 08/07/2007   Qualifier: Diagnosis of  By: Jenny Reichmann MD, Hunt Oris   . DEPRESSION 03/31/2007   Qualifier: Diagnosis of  By: [place], [place], COLON 04/06/2007   Qualifier: Diagnosis of  By: Jenny Reichmann MD, [place], URINARY 08/07/2007   Qualifier: Diagnosis of  By: Jenny Reichmann MD, Hunt Oris   . GERD 08/07/2007   Qualifier: Diagnosis of  By: Jenny Reichmann MD, [place], INTERNAL 08/02/2007   Qualifier: Diagnosis of  By: Ronnald Ramp RN, [place], [place]    . Hypertension   . IBS 08/07/2007   Qualifier: Diagnosis of  By: Jenny Reichmann MD, [place], LEFT 03/31/2007   Qualifier: Diagnosis of  By: [place], [place] 04/06/2007   Qualifier: Diagnosis of  By: Jenny Reichmann MD, Hunt Oris   . Personal history of urinary calculi 03/31/2007   Centricity Description: RENAL CALCULUS, HX OF Qualifier: Diagnosis of  By: Elveria Royals  Centricity Description: NEPHROLITHIASIS, HX OF Qualifier: Diagnosis of  By: Jenny Reichmann MD, Hunt Oris   . RASH-NONVESICULAR 08/07/2007   Qualifier: Diagnosis of  By: Jenny Reichmann MD, [place] BLEEDING 08/02/2007  Qualifier: Diagnosis of  By: Ronnald Ramp RN, [place], [place]    . THROMBOCYTOPENIA 04/06/2007   Qualifier: Diagnosis of  By: Jenny Reichmann MD, Hunt Oris     Past Surgical History:  Procedure Laterality Date  . HERNIA REPAIR      Social History   Socioeconomic History  . Marital status: Married    Spouse name: Not on file  . Number of children: 2  . Years of education: Not on file  . Highest education level: Not on file  Occupational History  . Not on file  Tobacco Use  . Smoking status: Never Smoker  . Smokeless tobacco: Never Used  Substance and Sexual Activity  . Alcohol use: Yes    Comment: Occ  . Drug use: No  . Sexual activity: Not on file  Other Topics Concern  . Not on file  Social History  Narrative  . Not on file   Social Determinants of Health   Financial Resource Strain: Not on file  Food Insecurity: Not on file  Transportation Needs: Not on file  Physical Activity: Not on file  Stress: Not on file  Social Connections: Not on file  Intimate Partner Violence: Not on file    Family History  Problem Relation Age of Onset  . Heart disease Mother   . Cancer Father     ROS: no fevers or chills, productive cough, hemoptysis, dysphasia, odynophagia, melena, hematochezia, dysuria, hematuria, rash, seizure activity, orthopnea, PND, pedal edema, claudication. Remaining systems are negative.  Physical Exam: Well-developed well-nourished in no acute distress.  Skin is warm and dry.  HEENT is normal.  Neck is supple.  Chest is clear to auscultation with normal expansion.  Cardiovascular exam is regular rate and rhythm.  Abdominal exam nontender or distended. No masses palpated. Extremities show no edema. neuro grossly intact  ECG-normal sinus rhythm with occasional PAC, cannot rule out septal infarct.  Personally reviewed  A/P  1 paroxysmal atrial fibrillation-patient had one episode of atrial fibrillation years ago in the setting of alcohol use.  He has remained in sinus since that time and we have therefore not anticoagulated.  Can reconsider if he has more episodes in the future.  2 hypertension-blood pressure controlled.  Continue present medications.  Potassium and renal function monitored by primary care.  3 history of alcohol abuse-consumes 3-5 beers daily.  I asked him to limit this to no more than 1-2.  Kirk Ruths, MD

## 2020-11-03 ENCOUNTER — Encounter: Payer: Self-pay | Admitting: Cardiology

## 2020-11-03 ENCOUNTER — Ambulatory Visit (INDEPENDENT_AMBULATORY_CARE_PROVIDER_SITE_OTHER): Payer: Medicare Other | Admitting: Cardiology

## 2020-11-03 ENCOUNTER — Other Ambulatory Visit: Payer: Self-pay

## 2020-11-03 VITALS — BP 140/80 | HR 62 | Ht 67.0 in | Wt 177.2 lb

## 2020-11-03 DIAGNOSIS — I480 Paroxysmal atrial fibrillation: Secondary | ICD-10-CM

## 2020-11-03 DIAGNOSIS — I10 Essential (primary) hypertension: Secondary | ICD-10-CM | POA: Diagnosis not present

## 2020-11-03 NOTE — Patient Instructions (Signed)
Medication Instructions:  Your physician recommends that you continue on your current medications as directed. Please refer to the Current Medication list given to you today.  *If you need a refill on your cardiac medications before your next appointment, please call your pharmacy*   Lab Work: None ordered   Testing/Procedures: None ordered   Follow-Up: At [facility], you and your health needs are our priority.  As part of our continuing mission to provide you with exceptional heart care, we have created designated Provider Care Teams.  These Care Teams include your primary Cardiologist (physician) and Advanced Practice Providers (APPs -  Physician Assistants and Nurse Practitioners) who all work together to provide you with the care you need, when you need it.  We recommend signing up for the patient portal called "MyChart".  Sign up information is provided on this After Visit Summary.  MyChart is used to connect with patients for Virtual Visits (Telemedicine).  Patients are able to view lab/test results, encounter notes, upcoming appointments, etc.  Non-urgent messages can be sent to your provider as well.   To learn more about what you can do with MyChart, go to [email or web address].    Your next appointment:   12 month(s)  The format for your next appointment:   In Person  Provider:   Kirk Ruths, MD

## 2020-11-09 ENCOUNTER — Other Ambulatory Visit: Payer: Self-pay | Admitting: General Practice

## 2020-11-09 DIAGNOSIS — Z87828 Personal history of other (healed) physical injury and trauma: Secondary | ICD-10-CM

## 2020-11-09 DIAGNOSIS — R52 Pain, unspecified: Secondary | ICD-10-CM

## 2020-11-09 DIAGNOSIS — M2669 Other specified disorders of temporomandibular joint: Secondary | ICD-10-CM

## 2020-11-23 ENCOUNTER — Ambulatory Visit
Admission: RE | Admit: 2020-11-23 | Discharge: 2020-11-23 | Disposition: A | Discharge status: Home / Self Care | Payer: Medicare Other | Source: Ambulatory Visit | Attending: General Practice | Admitting: General Practice

## 2020-11-23 ENCOUNTER — Other Ambulatory Visit: Payer: Self-pay

## 2020-11-23 DIAGNOSIS — M2669 Other specified disorders of temporomandibular joint: Secondary | ICD-10-CM

## 2020-11-23 DIAGNOSIS — R52 Pain, unspecified: Secondary | ICD-10-CM

## 2020-11-23 DIAGNOSIS — Z87828 Personal history of other (healed) physical injury and trauma: Secondary | ICD-10-CM

## 2020-12-11 DIAGNOSIS — H353124 Nonexudative age-related macular degeneration, left eye, advanced atrophic with subfoveal involvement: Secondary | ICD-10-CM | POA: Diagnosis not present

## 2020-12-11 DIAGNOSIS — H353112 Nonexudative age-related macular degeneration, right eye, intermediate dry stage: Secondary | ICD-10-CM | POA: Diagnosis not present

## 2020-12-11 DIAGNOSIS — H43811 Vitreous degeneration, right eye: Secondary | ICD-10-CM | POA: Diagnosis not present

## 2020-12-11 DIAGNOSIS — H35371 Puckering of macula, right eye: Secondary | ICD-10-CM | POA: Diagnosis not present

## 2020-12-22 DIAGNOSIS — G501 Atypical facial pain: Secondary | ICD-10-CM | POA: Diagnosis not present

## 2020-12-22 DIAGNOSIS — F458 Other somatoform disorders: Secondary | ICD-10-CM | POA: Diagnosis not present

## 2021-05-26 DIAGNOSIS — Z23 Encounter for immunization: Secondary | ICD-10-CM | POA: Diagnosis not present

## 2021-06-03 IMAGING — US US ABDOMEN LIMITED
1 series · 14 of 25 positions shown · non-contrast
Comparison: Abdominal ultrasound 03/01/2019

CLINICAL DATA: Cirrhosis, annual follow-up.

EXAM:
ULTRASOUND ABDOMEN LIMITED RIGHT UPPER QUADRANT

[Series 1: us abdomen limited ruq · 14 of 76 slices shown]
[im 1/76]
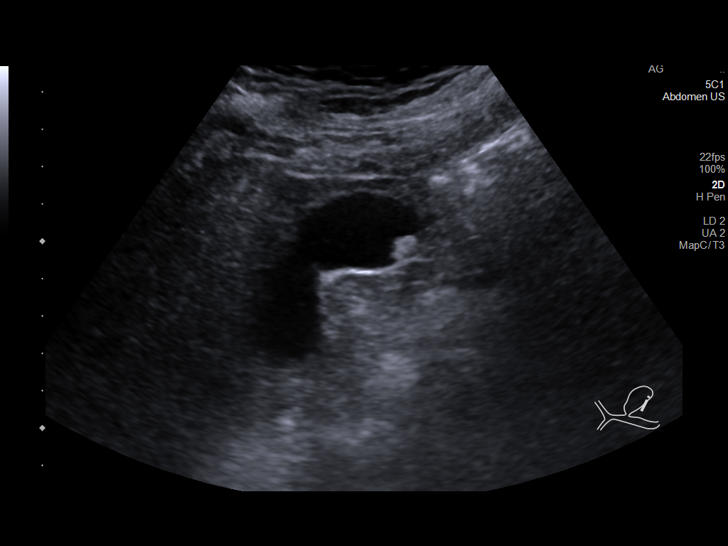
[im 7/76]
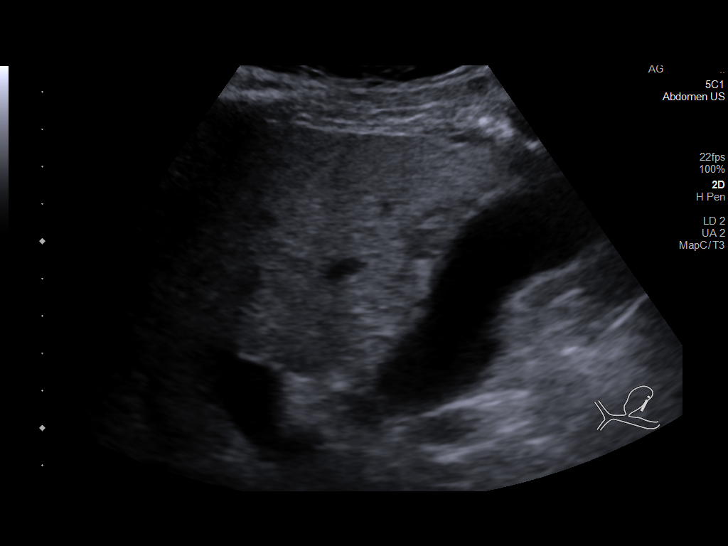
[im 13/76]
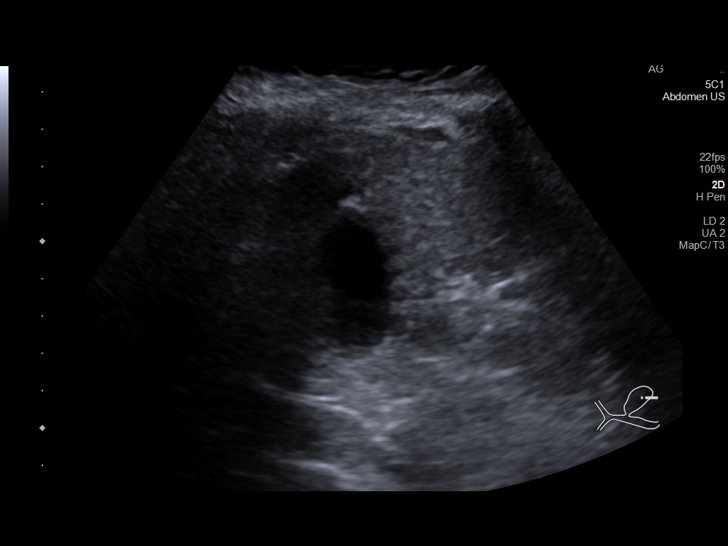
[im 19/76]
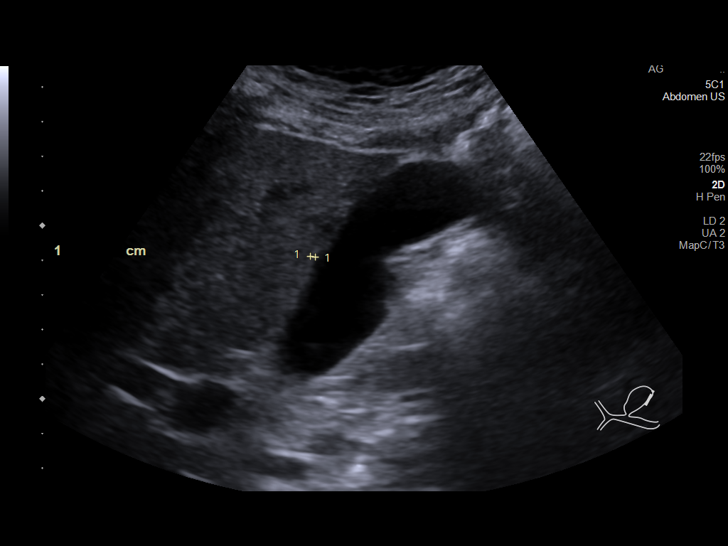
[im 26/76]
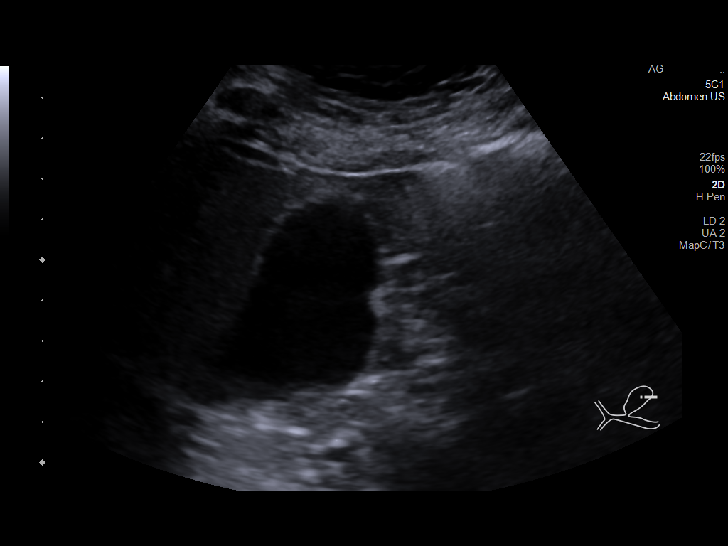
[im 29/76]
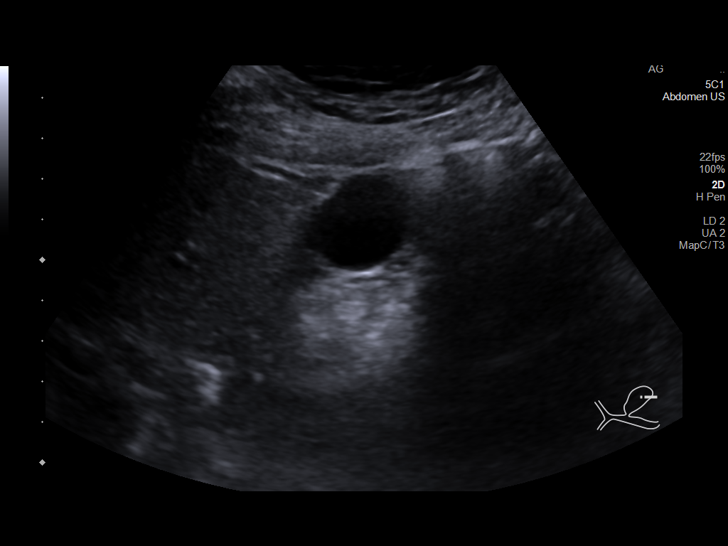
[im 35/76]
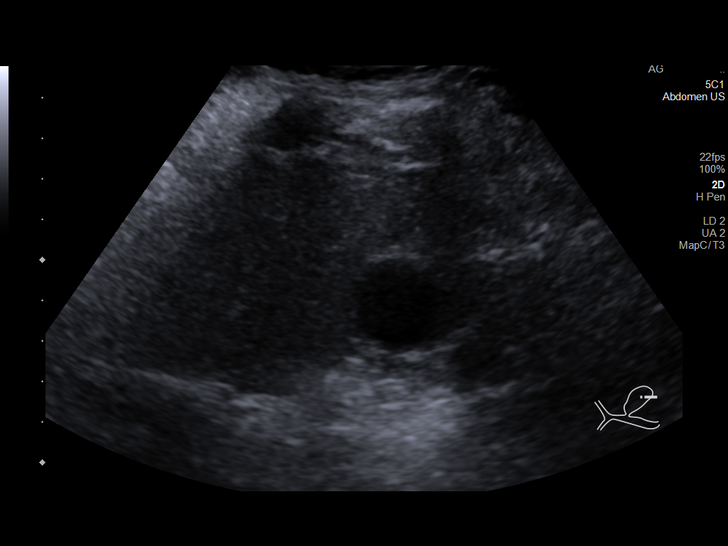
[im 41/76]
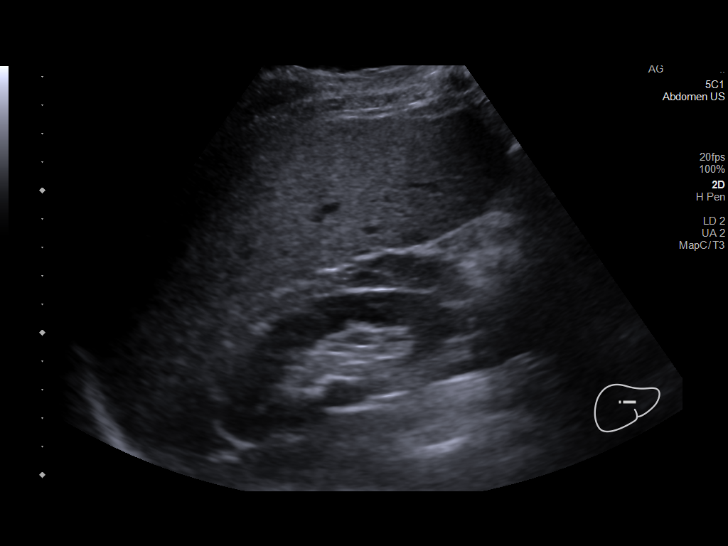
[im 47/76]
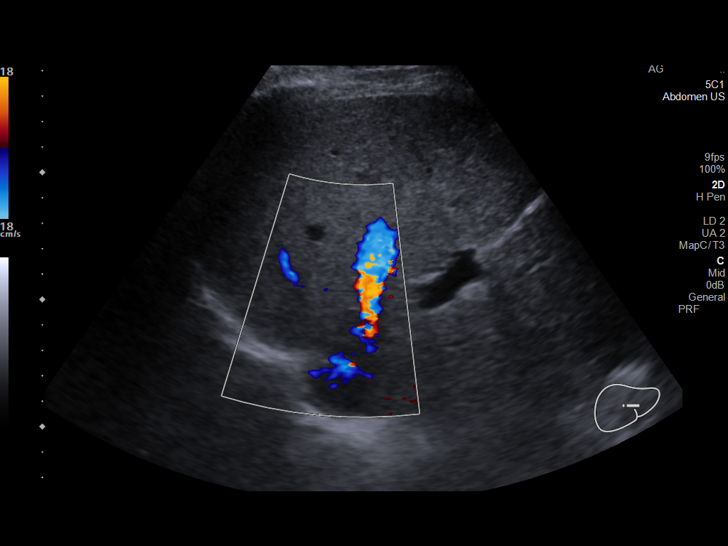
[im 51/76]
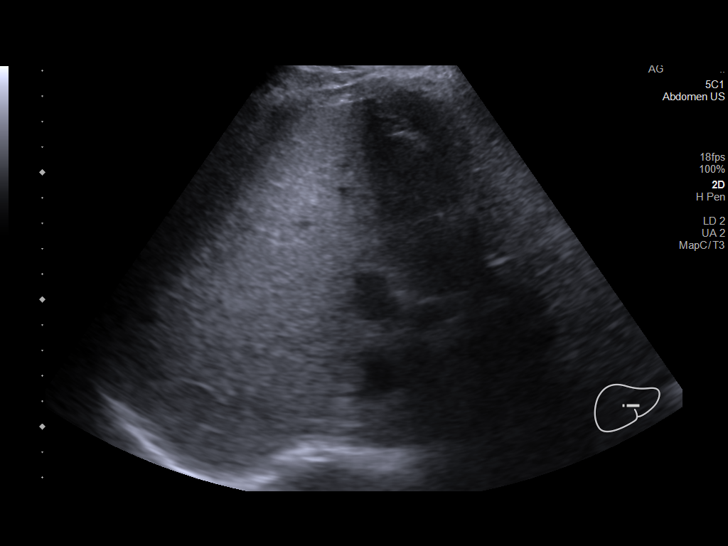
[im 57/76]
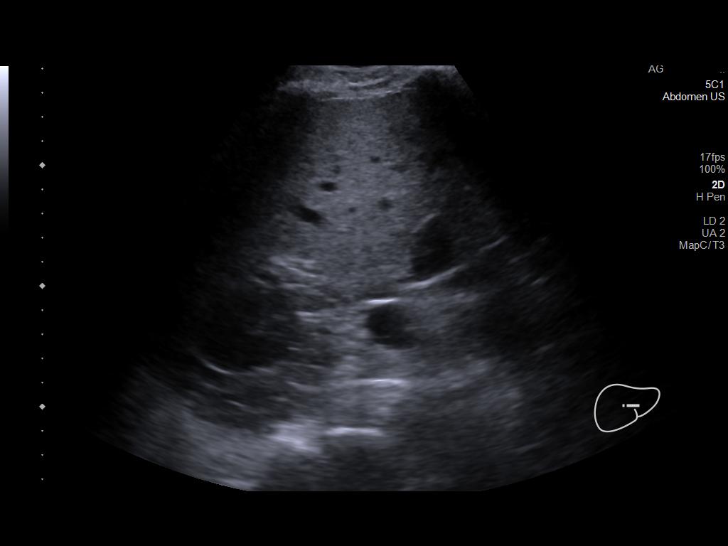
[im 63/76]
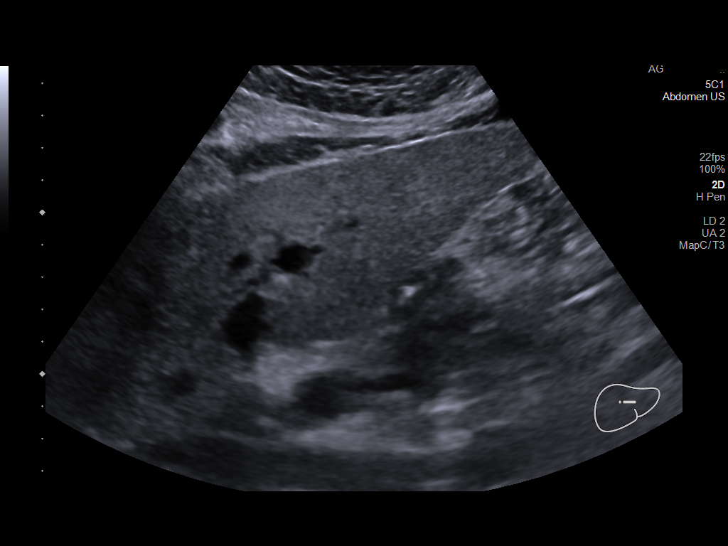
[im 69/76]
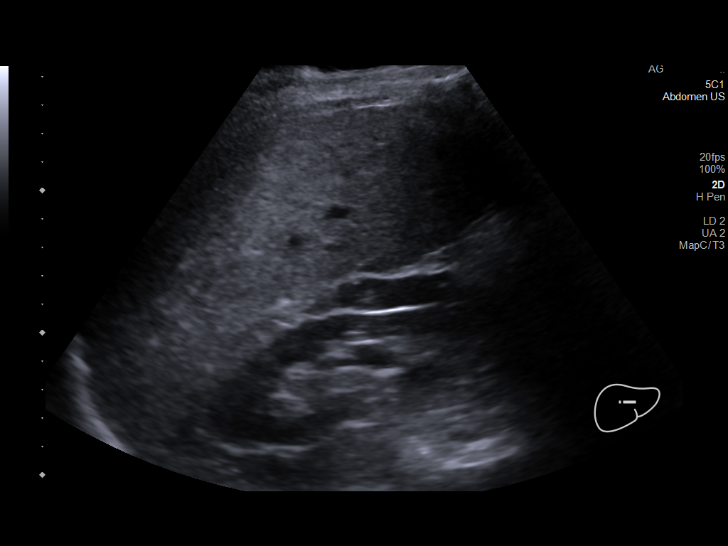
[im 76/76]
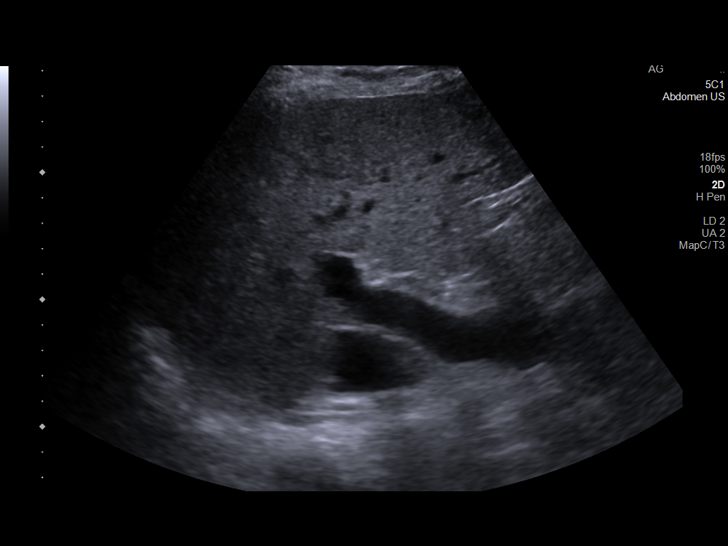

[14 of 25 positions shown; findings below may reference images not displayed]

FINDINGS: Gallbladder:

No gallstones or wall thickening visualized. A non mobile echogenic
focus is again seen in the fundus measuring 0.7 cm, compatible with
a polyp. No sonographic Murphy sign noted by sonographer.

Common bile duct:

Diameter: 0.4 cm

Liver:

No focal lesion identified. Nodular liver contour with heterogeneous
echotexture. Portal vein is patent on color Doppler imaging with
normal direction of blood flow towards the liver.

Other: None.
IMPRESSION: 1. Appearance of the liver in keeping with history of cirrhosis. No
new focal lesion identified.

2. Stable small polyp in the fundus of the gallbladder. Recommend
continued yearly follow-up.

## 2021-08-12 DIAGNOSIS — H353112 Nonexudative age-related macular degeneration, right eye, intermediate dry stage: Secondary | ICD-10-CM | POA: Diagnosis not present

## 2021-08-12 DIAGNOSIS — H2181 Floppy iris syndrome: Secondary | ICD-10-CM | POA: Diagnosis not present

## 2021-08-12 DIAGNOSIS — H26491 Other secondary cataract, right eye: Secondary | ICD-10-CM | POA: Diagnosis not present

## 2021-08-12 DIAGNOSIS — H5703 Miosis: Secondary | ICD-10-CM | POA: Diagnosis not present

## 2021-08-12 DIAGNOSIS — Z9889 Other specified postprocedural states: Secondary | ICD-10-CM | POA: Diagnosis not present

## 2021-08-12 DIAGNOSIS — Z961 Presence of intraocular lens: Secondary | ICD-10-CM | POA: Diagnosis not present

## 2021-08-12 DIAGNOSIS — H353221 Exudative age-related macular degeneration, left eye, with active choroidal neovascularization: Secondary | ICD-10-CM | POA: Diagnosis not present

## 2021-08-12 DIAGNOSIS — H35371 Puckering of macula, right eye: Secondary | ICD-10-CM | POA: Diagnosis not present

## 2021-10-13 DIAGNOSIS — M170 Bilateral primary osteoarthritis of knee: Secondary | ICD-10-CM | POA: Diagnosis not present

## 2021-10-13 DIAGNOSIS — M25561 Pain in right knee: Secondary | ICD-10-CM | POA: Diagnosis not present

## 2021-10-13 DIAGNOSIS — M25562 Pain in left knee: Secondary | ICD-10-CM | POA: Diagnosis not present

## 2021-10-27 DIAGNOSIS — Z125 Encounter for screening for malignant neoplasm of prostate: Secondary | ICD-10-CM | POA: Diagnosis not present

## 2021-10-27 DIAGNOSIS — I480 Paroxysmal atrial fibrillation: Secondary | ICD-10-CM | POA: Diagnosis not present

## 2021-10-27 DIAGNOSIS — K7460 Unspecified cirrhosis of liver: Secondary | ICD-10-CM | POA: Diagnosis not present

## 2021-10-27 DIAGNOSIS — D696 Thrombocytopenia, unspecified: Secondary | ICD-10-CM | POA: Diagnosis not present

## 2021-10-27 DIAGNOSIS — K219 Gastro-esophageal reflux disease without esophagitis: Secondary | ICD-10-CM | POA: Diagnosis not present

## 2021-10-27 DIAGNOSIS — F32A Depression, unspecified: Secondary | ICD-10-CM | POA: Diagnosis not present

## 2021-10-27 DIAGNOSIS — G4700 Insomnia, unspecified: Secondary | ICD-10-CM | POA: Diagnosis not present

## 2021-10-27 DIAGNOSIS — Z79899 Other long term (current) drug therapy: Secondary | ICD-10-CM | POA: Diagnosis not present

## 2021-11-03 ENCOUNTER — Other Ambulatory Visit: Payer: Self-pay | Admitting: Internal Medicine

## 2021-11-03 ENCOUNTER — Other Ambulatory Visit (HOSPITAL_COMMUNITY): Payer: Self-pay | Admitting: Internal Medicine

## 2021-11-03 DIAGNOSIS — F325 Major depressive disorder, single episode, in full remission: Secondary | ICD-10-CM | POA: Diagnosis not present

## 2021-11-03 DIAGNOSIS — K745 Biliary cirrhosis, unspecified: Secondary | ICD-10-CM | POA: Diagnosis not present

## 2021-11-03 DIAGNOSIS — I10 Essential (primary) hypertension: Secondary | ICD-10-CM | POA: Diagnosis not present

## 2021-11-03 DIAGNOSIS — D696 Thrombocytopenia, unspecified: Secondary | ICD-10-CM | POA: Diagnosis not present

## 2021-11-03 DIAGNOSIS — N401 Enlarged prostate with lower urinary tract symptoms: Secondary | ICD-10-CM | POA: Diagnosis not present

## 2021-11-03 DIAGNOSIS — Z6829 Body mass index (BMI) 29.0-29.9, adult: Secondary | ICD-10-CM | POA: Diagnosis not present

## 2021-11-03 DIAGNOSIS — C220 Liver cell carcinoma: Secondary | ICD-10-CM

## 2021-11-08 ENCOUNTER — Ambulatory Visit (HOSPITAL_COMMUNITY)
Admission: RE | Admit: 2021-11-08 | Discharge: 2021-11-08 | Disposition: A | Discharge status: Home / Self Care | Payer: Medicare Other | Source: Ambulatory Visit | Attending: Internal Medicine | Admitting: Internal Medicine

## 2021-11-08 DIAGNOSIS — K824 Cholesterolosis of gallbladder: Secondary | ICD-10-CM | POA: Diagnosis not present

## 2021-11-08 DIAGNOSIS — C220 Liver cell carcinoma: Secondary | ICD-10-CM | POA: Diagnosis not present

## 2022-06-19 ENCOUNTER — Encounter (HOSPITAL_BASED_OUTPATIENT_CLINIC_OR_DEPARTMENT_OTHER): Payer: Self-pay | Admitting: Emergency Medicine

## 2022-06-19 ENCOUNTER — Emergency Department (HOSPITAL_BASED_OUTPATIENT_CLINIC_OR_DEPARTMENT_OTHER)
Admission: EM | Admit: 2022-06-19 | Discharge: 2022-06-19 | Disposition: A | Discharge status: Home / Self Care | Payer: Medicare Other | Attending: Emergency Medicine | Admitting: Emergency Medicine

## 2022-06-19 ENCOUNTER — Other Ambulatory Visit: Payer: Self-pay

## 2022-06-19 DIAGNOSIS — U071 COVID-19: Secondary | ICD-10-CM | POA: Diagnosis not present

## 2022-06-19 DIAGNOSIS — R059 Cough, unspecified: Secondary | ICD-10-CM | POA: Diagnosis present

## 2022-06-19 DIAGNOSIS — Z7982 Long term (current) use of aspirin: Secondary | ICD-10-CM | POA: Insufficient documentation

## 2022-06-19 LAB — BASIC METABOLIC PANEL
Anion gap: 9 (ref 5–15)
BUN: 9 mg/dL (ref 8–23)
CO2: 28 mmol/L (ref 22–32)
Calcium: 8.7 mg/dL — ABNORMAL LOW (ref 8.9–10.3)
Chloride: 102 mmol/L (ref 98–111)
Creatinine, Ser: 0.96 mg/dL (ref 0.61–1.24)
GFR, Estimated: >60 mL/min (ref >60)
Glucose, Bld: 111 mg/dL — ABNORMAL HIGH (ref 70–99)
Potassium: 4 mmol/L (ref 3.5–5.1)
Sodium: 139 mmol/L (ref 135–145)

## 2022-06-19 LAB — CBC
HCT: 40.3 % (ref 39.0–52.0)
Hemoglobin: 13.9 g/dL (ref 13.0–17.0)
MCH: 32.3 pg (ref 26.0–34.0)
MCHC: 34.5 g/dL (ref 30.0–36.0)
MCV: 93.7 fL (ref 80.0–100.0)
Platelets: 69 10*3/uL — ABNORMAL LOW (ref 150–400)
RBC: 4.3 MIL/uL (ref 4.22–5.81)
RDW: 12.8 % (ref 11.5–15.5)
WBC: 2.5 10*3/uL — ABNORMAL LOW (ref 4.0–10.5)
nRBC: 0 % (ref 0.0–0.2)

## 2022-06-19 MED ORDER — ACETAMINOPHEN 325 MG PO TABS
650.0000 mg | ORAL_TABLET | Freq: Once | ORAL | Status: AC
  Administered 2022-06-19: 650 mg via ORAL
  Filled 2022-06-19: qty 2

## 2022-06-19 MED ORDER — NIRMATRELVIR/RITONAVIR (PAXLOVID)TABLET: 3.0000 | ORAL_TABLET | Freq: Two times a day (BID) | ORAL | 0 refills | Status: AC

## 2022-06-19 NOTE — Discharge Instructions (Signed)
Do not take tamsulosin while taking Paxlovid Please drink plenty of fluids Use acetaminophen and ibuprofen as needed for fluids and body aches Return if you are having worsening symptoms especially if you become short of breath or unable to tolerate fluids Recheck with your doctor for recheck this week

## 2022-06-19 NOTE — ED Triage Notes (Signed)
2 day cough/weak, +covid test, wants paxlovid if available.

## 2022-06-19 NOTE — ED Notes (Signed)
Note: his pleth beats per pulse ox. vary slightly in wave height; therefore the pulse ox. Is erroneously reporting a slow heart rate. All of his beats are pulsatile.

## 2022-06-19 NOTE — ED Provider Notes (Signed)
[place] EMERGENCY DEPT Provider Note   CSN: [id number] Arrival date & time: 06/19/22  1050     History  Chief Complaint  Patient presents with   URI    Shane Ochoa is a 81 y.o. male.  HPI 81 year old male presents today complaining of cough and congestion as well as diffuse muscle aches.  He began having symptoms 2 days ago.  He was exposed to somebody with COVID last week.  His wife did a COVID test at home today and is positive.  He denies any dyspnea, chest pain, nausea, vomiting, or diarrhea.    Home Medications Prior to Admission medications   Medication Sig Start Date End Date Taking? Authorizing Provider  nirmatrelvir/ritonavir EUA (PAXLOVID) 20 x 150 MG & 10 x '100MG'$  TABS Take 3 tablets by mouth 2 (two) times daily for 5 days. Patient GFR is normal. Take nirmatrelvir (150 mg) two tablets twice daily for 5 days and ritonavir (100 mg) one tablet twice daily for 5 days. 06/19/22 06/24/22 Yes Pattricia Boss, MD  aspirin 81 MG tablet Take 81 mg by mouth daily.    [provider]  escitalopram (LEXAPRO) 20 MG tablet Take 20 mg by mouth daily.    [provider]  losartan-hydrochlorothiazide (HYZAAR) 100-25 MG per tablet Take 1 tablet by mouth daily.    [provider]  Multiple Vitamins-Minerals (PRESERVISION AREDS 2+MULTI VIT PO) Take by mouth daily. 1 Tablet Daily    [provider]  pantoprazole (PROTONIX) 40 MG tablet Take 40 mg by mouth daily.    [provider]  Tamsulosin HCl (FLOMAX) 0.4 MG CAPS Take 0.4 mg by mouth daily.    [provider]  zolpidem (AMBIEN) 10 MG tablet Take 5 mg by mouth at bedtime as needed. For sleep    [provider]      Allergies    Antihistamines, chlorpheniramine-type    Review of Systems   Review of Systems  Physical Exam Updated Vital Signs BP (!) 158/67   Pulse 75   Temp 98 F (36.7 C)   Resp 17   SpO2 95%  Physical Exam Vitals and nursing note  reviewed.  Constitutional:      Appearance: Normal appearance.  HENT:     Head: Normocephalic.     Right Ear: External ear normal.     Left Ear: External ear normal.     Nose: Nose normal.     Mouth/Throat:     Pharynx: Oropharynx is clear.  Neurological:     Mental Status: He is alert.     ED Results / Procedures / Treatments   Labs (all labs ordered are listed, but only abnormal results are displayed) Labs Reviewed  BASIC METABOLIC PANEL - Abnormal; Notable for the following components:      Result Value   Glucose, Bld 111 (*)    Calcium 8.7 (*)    All other components within normal limits  CBC - Abnormal; Notable for the following components:   WBC 2.5 (*)    Platelets 69 (*)    All other components within normal limits    EKG None  Radiology No results found.  Procedures Procedures    Medications Ordered in ED Medications  acetaminophen (TYLENOL) tablet 650 mg (650 mg Oral Given 06/19/22 1154)    ED Course/ Medical Decision Making/ A&P  Medical Decision Making Amount and/or Complexity of Data Reviewed Labs: ordered.  Risk OTC drugs.           Final Clinical Impression(s) / ED Diagnoses Final diagnoses:  COVID    Rx / DC Orders ED Discharge Orders          Ordered    nirmatrelvir/ritonavir EUA (PAXLOVID) 20 x 150 MG & 10 x '100MG'$  TABS  2 times daily        06/19/22 1326              Pattricia Boss, MD 06/19/22 1406

## 2022-08-24 DIAGNOSIS — H353222 Exudative age-related macular degeneration, left eye, with inactive choroidal neovascularization: Secondary | ICD-10-CM | POA: Diagnosis not present

## 2022-08-24 DIAGNOSIS — Z961 Presence of intraocular lens: Secondary | ICD-10-CM | POA: Diagnosis not present

## 2022-08-24 DIAGNOSIS — H26491 Other secondary cataract, right eye: Secondary | ICD-10-CM | POA: Diagnosis not present

## 2022-08-24 DIAGNOSIS — H35371 Puckering of macula, right eye: Secondary | ICD-10-CM | POA: Diagnosis not present

## 2022-08-24 DIAGNOSIS — Z9889 Other specified postprocedural states: Secondary | ICD-10-CM | POA: Diagnosis not present

## 2022-08-24 DIAGNOSIS — H2181 Floppy iris syndrome: Secondary | ICD-10-CM | POA: Diagnosis not present

## 2022-08-24 DIAGNOSIS — H353112 Nonexudative age-related macular degeneration, right eye, intermediate dry stage: Secondary | ICD-10-CM | POA: Diagnosis not present

## 2022-08-24 DIAGNOSIS — H5703 Miosis: Secondary | ICD-10-CM | POA: Diagnosis not present

## 2022-09-30 ENCOUNTER — Other Ambulatory Visit (HOSPITAL_BASED_OUTPATIENT_CLINIC_OR_DEPARTMENT_OTHER): Payer: Self-pay

## 2022-09-30 ENCOUNTER — Emergency Department (HOSPITAL_BASED_OUTPATIENT_CLINIC_OR_DEPARTMENT_OTHER)
Admission: EM | Admit: 2022-09-30 | Discharge: 2022-09-30 | Disposition: A | Discharge status: Home / Self Care | Payer: Medicare Other | Attending: Emergency Medicine | Admitting: Emergency Medicine

## 2022-09-30 ENCOUNTER — Other Ambulatory Visit: Payer: Self-pay

## 2022-09-30 ENCOUNTER — Encounter (HOSPITAL_BASED_OUTPATIENT_CLINIC_OR_DEPARTMENT_OTHER): Payer: Self-pay | Admitting: Emergency Medicine

## 2022-09-30 ENCOUNTER — Emergency Department (HOSPITAL_BASED_OUTPATIENT_CLINIC_OR_DEPARTMENT_OTHER): Payer: Medicare Other

## 2022-09-30 DIAGNOSIS — R001 Bradycardia, unspecified: Secondary | ICD-10-CM | POA: Insufficient documentation

## 2022-09-30 DIAGNOSIS — Z79899 Other long term (current) drug therapy: Secondary | ICD-10-CM | POA: Insufficient documentation

## 2022-09-30 DIAGNOSIS — K6389 Other specified diseases of intestine: Secondary | ICD-10-CM | POA: Diagnosis not present

## 2022-09-30 DIAGNOSIS — R112 Nausea with vomiting, unspecified: Secondary | ICD-10-CM | POA: Insufficient documentation

## 2022-09-30 DIAGNOSIS — R1084 Generalized abdominal pain: Secondary | ICD-10-CM | POA: Insufficient documentation

## 2022-09-30 DIAGNOSIS — R197 Diarrhea, unspecified: Secondary | ICD-10-CM | POA: Diagnosis not present

## 2022-09-30 DIAGNOSIS — K3189 Other diseases of stomach and duodenum: Secondary | ICD-10-CM | POA: Diagnosis not present

## 2022-09-30 DIAGNOSIS — K7689 Other specified diseases of liver: Secondary | ICD-10-CM | POA: Diagnosis not present

## 2022-09-30 DIAGNOSIS — Z7982 Long term (current) use of aspirin: Secondary | ICD-10-CM | POA: Diagnosis not present

## 2022-09-30 LAB — URINALYSIS, ROUTINE W REFLEX MICROSCOPIC
Bacteria, UA: NONE SEEN
Bilirubin Urine: NEGATIVE
Glucose, UA: NEGATIVE mg/dL
Hgb urine dipstick: NEGATIVE
Ketones, ur: NEGATIVE mg/dL
Leukocytes,Ua: NEGATIVE
Nitrite: NEGATIVE
Protein, ur: 30 mg/dL — AB
Specific Gravity, Urine: 1.031 — ABNORMAL HIGH (ref 1.005–1.030)
pH: 6 (ref 5.0–8.0)

## 2022-09-30 LAB — CBC WITH DIFFERENTIAL/PLATELET
Abs Immature Granulocytes: 0.02 10*3/uL (ref 0.00–0.07)
Basophils Absolute: 0 10*3/uL (ref 0.0–0.1)
Basophils Relative: 0 %
Eosinophils Absolute: 0 10*3/uL (ref 0.0–0.5)
Eosinophils Relative: 1 %
HCT: 47.9 % (ref 39.0–52.0)
Hemoglobin: 16.4 g/dL (ref 13.0–17.0)
Immature Granulocytes: 1 %
Lymphocytes Relative: 13 %
Lymphs Abs: 0.4 10*3/uL — ABNORMAL LOW (ref 0.7–4.0)
MCH: 31.8 pg (ref 26.0–34.0)
MCHC: 34.2 g/dL (ref 30.0–36.0)
MCV: 92.8 fL (ref 80.0–100.0)
Monocytes Absolute: 0.3 10*3/uL (ref 0.1–1.0)
Monocytes Relative: 9 %
Neutro Abs: 2.5 10*3/uL (ref 1.7–7.7)
Neutrophils Relative %: 76 %
Platelets: 108 10*3/uL — ABNORMAL LOW (ref 150–400)
RBC: 5.16 MIL/uL (ref 4.22–5.81)
RDW: 13.6 % (ref 11.5–15.5)
WBC: 3.3 10*3/uL — ABNORMAL LOW (ref 4.0–10.5)
nRBC: 0 % (ref 0.0–0.2)

## 2022-09-30 LAB — COMPREHENSIVE METABOLIC PANEL
ALT: 93 U/L — ABNORMAL HIGH (ref 0–44)
AST: 116 U/L — ABNORMAL HIGH (ref 15–41)
Albumin: 4.3 g/dL (ref 3.5–5.0)
Alkaline Phosphatase: 63 U/L (ref 38–126)
Anion gap: 9 (ref 5–15)
BUN: 13 mg/dL (ref 8–23)
CO2: 21 mmol/L — ABNORMAL LOW (ref 22–32)
Calcium: 8.6 mg/dL — ABNORMAL LOW (ref 8.9–10.3)
Chloride: 107 mmol/L (ref 98–111)
Creatinine, Ser: 0.91 mg/dL (ref 0.61–1.24)
GFR, Estimated: >60 mL/min (ref >60)
Glucose, Bld: 120 mg/dL — ABNORMAL HIGH (ref 70–99)
Potassium: 3.2 mmol/L — ABNORMAL LOW (ref 3.5–5.1)
Sodium: 137 mmol/L (ref 135–145)
Total Bilirubin: 0.6 mg/dL (ref 0.3–1.2)
Total Protein: 7.2 g/dL (ref 6.5–8.1)

## 2022-09-30 LAB — MAGNESIUM: Magnesium: 2.1 mg/dL (ref 1.7–2.4)

## 2022-09-30 LAB — LIPASE, BLOOD: Lipase: 32 U/L (ref 11–51)

## 2022-09-30 MED ORDER — ONDANSETRON HCL 4 MG/2ML IJ SOLN
4.0000 mg | Freq: Once | INTRAMUSCULAR | Status: AC
  Administered 2022-09-30: 4 mg via INTRAVENOUS
  Filled 2022-09-30: qty 2

## 2022-09-30 MED ORDER — SODIUM CHLORIDE 0.9 % IV BOLUS
1000.0000 mL | Freq: Once | INTRAVENOUS | Status: AC
  Administered 2022-09-30: 1000 mL via INTRAVENOUS

## 2022-09-30 MED ORDER — ONDANSETRON 4 MG PO TBDP: ORAL_TABLET | ORAL | 0 refills | Status: DC

## 2022-09-30 MED ORDER — MORPHINE SULFATE (PF) 2 MG/ML IV SOLN
2.0000 mg | Freq: Once | INTRAVENOUS | Status: AC
  Administered 2022-09-30: 2 mg via INTRAVENOUS
  Filled 2022-09-30: qty 1

## 2022-09-30 MED ORDER — IOHEXOL 300 MG/ML  SOLN
100.0000 mL | Freq: Once | INTRAMUSCULAR | Status: AC | PRN
  Administered 2022-09-30: 100 mL via INTRAVENOUS

## 2022-09-30 MED ORDER — IOHEXOL 300 MG/ML  SOLN: 100.0000 mL | Freq: Once | INTRAMUSCULAR | Status: DC | PRN

## 2022-09-30 NOTE — Discharge Instructions (Signed)
Follow up with your doctor in the office.  Return for worsening pain, fever, inability to eat or drink.

## 2022-09-30 NOTE — ED Provider Notes (Signed)
[facility] Provider Note   CSN: [id number] Arrival date & time: 09/30/22  1133     History  Chief Complaint  Patient presents with   Emesis    Shane Ochoa is a 82 y.o. male.  82 yo M with a chief complaints of nausea vomiting and diarrhea.  This has been going on for about a week.  He felt like he could take care of things at home but with symptoms ongoing decided to come to the ER for evaluation.  Having diffuse crampy abdominal discomfort.  Feels like his stool has looked dark at times but feels like mostly it is like water.  Denies recent antibiotic use denies recent hospitalization.  No known sick contacts.   Emesis      Home Medications Prior to Admission medications   Medication Sig Start Date End Date Taking? Authorizing Provider  ondansetron (ZOFRAN-ODT) 4 MG disintegrating tablet 4mg  ODT q4 hours prn nausea/vomit 09/30/22  Yes Deno Etienne, DO  aspirin 81 MG tablet Take 81 mg by mouth daily.    [provider]  escitalopram (LEXAPRO) 20 MG tablet Take 20 mg by mouth daily.    [provider]  losartan-hydrochlorothiazide (HYZAAR) 100-25 MG per tablet Take 1 tablet by mouth daily.    [provider]  Multiple Vitamins-Minerals (PRESERVISION AREDS 2+MULTI VIT PO) Take by mouth daily. 1 Tablet Daily    [provider]  pantoprazole (PROTONIX) 40 MG tablet Take 40 mg by mouth daily.    [provider]  Tamsulosin HCl (FLOMAX) 0.4 MG CAPS Take 0.4 mg by mouth daily.    [provider]  zolpidem (AMBIEN) 10 MG tablet Take 5 mg by mouth at bedtime as needed. For sleep    [provider]      Allergies    Antihistamines, chlorpheniramine-type    Review of Systems   Review of Systems  Gastrointestinal:  Positive for vomiting.    Physical Exam Updated Vital Signs BP (!) 144/80   Pulse 63   Temp 98 F (36.7 C)   Resp 12   Ht 5\' 7"  (1.702 m)   Wt 74.8 kg   SpO2  98%   BMI 25.84 kg/m  Physical Exam Vitals and nursing note reviewed.  Constitutional:      Appearance: He is well-developed.  HENT:     Head: Normocephalic and atraumatic.  Eyes:     Pupils: Pupils are equal, round, and reactive to light.  Neck:     Vascular: No JVD.  Cardiovascular:     Rate and Rhythm: Regular rhythm. Bradycardia present.     Heart sounds: No murmur heard.    No friction rub. No gallop.  Pulmonary:     Effort: No respiratory distress.     Breath sounds: No wheezing.  Abdominal:     General: There is no distension.     Tenderness: There is no abdominal tenderness. There is no guarding or rebound.     Comments: Benign abdominal exam  Musculoskeletal:        General: Normal range of motion.     Cervical back: Normal range of motion and neck supple.  Skin:    Coloration: Skin is not pale.     Findings: No rash.  Neurological:     Mental Status: He is alert and oriented to person, place, and time.  Psychiatric:        Behavior: Behavior normal.     ED  Results / Procedures / Treatments   Labs (all labs ordered are listed, but only abnormal results are displayed) Labs Reviewed  CBC WITH DIFFERENTIAL/PLATELET - Abnormal; Notable for the following components:      Result Value   WBC 3.3 (*)    Platelets 108 (*)    Lymphs Abs 0.4 (*)    All other components within normal limits  COMPREHENSIVE METABOLIC PANEL - Abnormal; Notable for the following components:   Potassium 3.2 (*)    CO2 21 (*)    Glucose, Bld 120 (*)    Calcium 8.6 (*)    AST 116 (*)    ALT 93 (*)    All other components within normal limits  URINALYSIS, ROUTINE W REFLEX MICROSCOPIC - Abnormal; Notable for the following components:   Specific Gravity, Urine 1.031 (*)    Protein, ur 30 (*)    All other components within normal limits  GASTROINTESTINAL PANEL BY PCR, STOOL (REPLACES STOOL CULTURE)  C DIFFICILE QUICK SCREEN W PCR REFLEX    LIPASE, BLOOD  MAGNESIUM    EKG EKG  Interpretation  Date/Time:  Friday September 30 2022 12:01:05 EDT Ventricular Rate:  52 PR Interval:  158 QRS Duration: 94 QT Interval:  394 QTC Calculation: 367 R Axis:   -21 Text Interpretation: Sinus rhythm Atrial premature complexes Borderline left axis deviation Anterior infarct, old No significant change since last tracing Confirmed by Deno Etienne [phone]) on 09/30/2022 12:05:37 PM  Radiology CT ABDOMEN PELVIS W CONTRAST  Result Date: 09/30/2022 CLINICAL DATA:  82 year old male presenting with acute nonlocalized abdominal pain. EXAM: CT ABDOMEN AND PELVIS WITH CONTRAST TECHNIQUE: Multidetector CT imaging of the abdomen and pelvis was performed using the standard protocol following bolus administration of intravenous contrast. RADIATION DOSE REDUCTION: This exam was performed according to the departmental dose-optimization program which includes automated exposure control, adjustment of the mA and/or kV according to patient size and/or use of iterative reconstruction technique. CONTRAST:  138mL OMNIPAQUE IOHEXOL 300 MG/ML  SOLN COMPARISON:  October 19, 2008. FINDINGS: Lower chest: Unremarkable to the extent evaluated. Hepatobiliary: Lobular and nodular liver suspicious for cirrhosis. Subtle hypodense lesion in the posterolateral segment LEFT hepatic lobe 8 mm. This is stable over greater than 10 years time and is compatible with a benign lesion such as a cyst or hemangioma. The portal vein is patent. Hepatic veins are patent. No visible lesion on venous phase imaging with suspicious features in the liver. Pancreas: Normal, without mass, inflammation or ductal dilatation. Spleen: Mild splenomegaly. Adrenals/Urinary Tract: Adrenal glands are unremarkable. Symmetric renal enhancement. No sign of hydronephrosis. No suspicious renal lesion or perinephric stranding. Urinary bladder is grossly unremarkable. Stomach/Bowel: Stomach under distended without adjacent stranding. Mccaffrey hiatal hernia. Diffuse mild Pulver  bowel distension with fluid-filled features and without signs of discrete transition point. Appendix is normal. Query mild ileal hyperenhancement. Question of mild colonic thickening versus under distension. Vascular/Lymphatic: Scattered atherosclerotic changes without aneurysmal dilation. No signs of adenopathy in the abdomen or pelvis. Reproductive: Unremarkable by CT. Other: No ascites.  No pneumoperitoneum. Musculoskeletal: No acute bone finding. No destructive bone process. Spinal degenerative changes. Dextroconvex scoliotic curvature with degenerative changes of the lumbar spine not changed since prior imaging. IMPRESSION: 1. Findings most suggestive of gastroenteritis. Given presence of potential mild colonic thickening would suggest exclusion of processes such as C difficile. 2. Query mild hyperenhancement of the distal ileum/terminal ileum. This area is collapsed/under distended which limits assessment. Correlate with any repeated history of abdominal pain they might  warrant further evaluation. 3. Lobular and nodular liver suspicious for cirrhosis. 4. Mild splenomegaly. 5. Aortic atherosclerosis. Aortic Atherosclerosis (ICD10-I70.0). Electronically Signed   By: Zetta Bills M.D.   On: 09/30/2022 13:35    Procedures Procedures    Medications Ordered in ED Medications  iohexol (OMNIPAQUE) 300 MG/ML solution 100 mL (has no administration in time range)  sodium chloride 0.9 % bolus 1,000 mL (1,000 mLs Intravenous New Bag/Given 09/30/22 1159)  morphine (PF) 2 MG/ML injection 2 mg (2 mg Intravenous Given 09/30/22 1208)  ondansetron (ZOFRAN) injection 4 mg (4 mg Intravenous Given 09/30/22 1208)  iohexol (OMNIPAQUE) 300 MG/ML solution 100 mL (100 mLs Intravenous Contrast Given 09/30/22 1303)    ED Course/ Medical Decision Making/ A&P                             Medical Decision Making Amount and/or Complexity of Data Reviewed Labs: ordered. Radiology: ordered. ECG/medicine tests:  ordered.  Risk Prescription drug management.   82 yo M with a chief complaints of nausea vomiting and diarrhea.  Going on for the past week.  Complaining of some abdominal discomfort with this as well.  Will obtain CT imaging.  Treat pain and nausea.  Lab work.  Reassess.  LFTs and lipase are unremarkable.  No significant electrolyte abnormality.  CT scan consistent with an enteritis, radiology read concerning for C. difficile.  Feeling this is less likely as the patient has not had a bowel movement since arriving in the ER.  He is feeling a bit better on repeat assessment.  Will start him on antiemetics.  Have him follow-up with his family doctor in the office.  Offered to obtain stool studies but was unable to provide a sample here.  2:53 PM:  I have discussed the diagnosis/risks/treatment options with the patient.  Evaluation and diagnostic testing in the emergency department does not suggest an emergent condition requiring admission or immediate intervention beyond what has been performed at this time.  They will follow up with PCP. We also discussed returning to the ED immediately if new or worsening sx occur. We discussed the sx which are most concerning (e.g., sudden worsening pain, fever, inability to tolerate by mouth) that necessitate immediate return. Medications administered to the patient during their visit and any new prescriptions provided to the patient are listed below.  Medications given during this visit Medications  iohexol (OMNIPAQUE) 300 MG/ML solution 100 mL (has no administration in time range)  sodium chloride 0.9 % bolus 1,000 mL (1,000 mLs Intravenous New Bag/Given 09/30/22 1159)  morphine (PF) 2 MG/ML injection 2 mg (2 mg Intravenous Given 09/30/22 1208)  ondansetron (ZOFRAN) injection 4 mg (4 mg Intravenous Given 09/30/22 1208)  iohexol (OMNIPAQUE) 300 MG/ML solution 100 mL (100 mLs Intravenous Contrast Given 09/30/22 1303)     The patient appears reasonably screen  and/or stabilized for discharge and I doubt any other medical condition or other [facility] requiring further screening, evaluation, or treatment in the ED at this time prior to discharge.          Final Clinical Impression(s) / ED Diagnoses Final diagnoses:  Nausea vomiting and diarrhea    Rx / DC Orders ED Discharge Orders          Ordered    ondansetron (ZOFRAN-ODT) 4 MG disintegrating tablet        09/30/22 1356              Sir Mallis,  Linna Hoff, DO 09/30/22 1453

## 2022-09-30 NOTE — ED Notes (Signed)
Pt given cup and hat  to collect stool sample to bring in

## 2022-09-30 NOTE — ED Triage Notes (Signed)
Vomiting x 7 days  and has had diarrhea

## 2022-10-05 ENCOUNTER — Telehealth: Payer: Self-pay

## 2022-10-05 NOTE — Telephone Encounter (Signed)
     Patient  visit on 3/15  at [facility]   Have you been able to follow up with your primary care physician? Yes   The patient was or was not able to obtain any needed medicine or equipment. Yes   Are there diet recommendations that you are having difficulty following?na    Patient expresses understanding of discharge instructions and education provided has no other needs at this time. Yes      [facility] [phone], [place], [place] [address] Phone: [phone] Email: [email or web address]

## 2022-11-01 DIAGNOSIS — K7030 Alcoholic cirrhosis of liver without ascites: Secondary | ICD-10-CM | POA: Diagnosis not present

## 2022-11-01 DIAGNOSIS — N400 Enlarged prostate without lower urinary tract symptoms: Secondary | ICD-10-CM | POA: Diagnosis not present

## 2022-11-01 DIAGNOSIS — F329 Major depressive disorder, single episode, unspecified: Secondary | ICD-10-CM | POA: Diagnosis not present

## 2022-11-01 DIAGNOSIS — I10 Essential (primary) hypertension: Secondary | ICD-10-CM | POA: Diagnosis not present

## 2022-11-01 DIAGNOSIS — D696 Thrombocytopenia, unspecified: Secondary | ICD-10-CM | POA: Diagnosis not present

## 2022-11-01 DIAGNOSIS — K219 Gastro-esophageal reflux disease without esophagitis: Secondary | ICD-10-CM | POA: Diagnosis not present

## 2022-11-01 DIAGNOSIS — Z79899 Other long term (current) drug therapy: Secondary | ICD-10-CM | POA: Diagnosis not present

## 2022-11-01 DIAGNOSIS — I480 Paroxysmal atrial fibrillation: Secondary | ICD-10-CM | POA: Diagnosis not present

## 2022-11-23 ENCOUNTER — Ambulatory Visit
Admission: EM | Admit: 2022-11-23 | Discharge: 2022-11-23 | Disposition: A | Discharge status: Home / Self Care | Payer: Medicare Other | Attending: Family Medicine | Admitting: Family Medicine

## 2022-11-23 ENCOUNTER — Other Ambulatory Visit: Payer: Self-pay

## 2022-11-23 ENCOUNTER — Encounter: Payer: Self-pay | Admitting: Emergency Medicine

## 2022-11-23 DIAGNOSIS — R42 Dizziness and giddiness: Secondary | ICD-10-CM | POA: Diagnosis not present

## 2022-11-23 DIAGNOSIS — J0100 Acute maxillary sinusitis, unspecified: Secondary | ICD-10-CM | POA: Diagnosis not present

## 2022-11-23 DIAGNOSIS — I10 Essential (primary) hypertension: Secondary | ICD-10-CM

## 2022-11-23 MED ORDER — AMOXICILLIN 875 MG PO TABS: 875.0000 mg | ORAL_TABLET | Freq: Two times a day (BID) | ORAL | 0 refills | Status: AC

## 2022-11-23 NOTE — Discharge Instructions (Addendum)
Your ECG did not show any changes when compared to your previous ones. I do not think at all that the dizziness you're feeling is related to any heart issue. Very likely your dizziness is related to your recent congestion and possible sinus infection.  Your blood pressure was noted to be elevated during your visit today. If you are currently taking medication for high blood pressure, please ensure you are taking this as directed. If you do not have a history of high blood pressure and your blood pressure remains persistently elevated, you may need to begin taking a medication at some point. You may return here within the next few days to recheck if unable to see your primary care provider or if you do not have a one.  BP (!) 187/74 (BP Location: Right Arm) Comment: states has not taken BP meds this morning  Pulse 66   Temp 98.1 F (36.7 C) (Oral)   Resp 18   SpO2 94%   BP Readings from Last 3 Encounters:  11/23/22 (!) 187/74  09/30/22 (!) 144/80  06/19/22 (!) 158/67

## 2022-11-23 NOTE — ED Triage Notes (Signed)
Feels dizzy and weak x 1.5 weeks.  States today is worse.  States runny nose and nasal congestion.  Facial pain around eye area

## 2022-11-24 NOTE — ED Provider Notes (Signed)
[facility]   [id number] 11/23/22 Arrival Time: 1021  ASSESSMENT & PLAN:  1. Dizziness   2. Acute non-recurrent maxillary sinusitis   3. Elevated blood pressure reading with diagnosis of hypertension    Begin: Meds ordered this encounter  Medications   amoxicillin (AMOXIL) 875 MG tablet    Sig: Take 1 tablet (875 mg total) by mouth 2 (two) times daily for 10 days.    Dispense:  20 tablet    Refill:  0  Normal neurological exam. ECG: NSR. No acute changes compared to previous in Epic.    Discharge Instructions      Your ECG did not show any changes when compared to your previous ones. I do not think at all that the dizziness you're feeling is related to any heart issue. Very likely your dizziness is related to your recent congestion and possible sinus infection.  Your blood pressure was noted to be elevated during your visit today. If you are currently taking medication for high blood pressure, please ensure you are taking this as directed. If you do not have a history of high blood pressure and your blood pressure remains persistently elevated, you may need to begin taking a medication at some point. You may return here within the next few days to recheck if unable to see your primary care provider or if you do not have a one.  BP (!) 187/74 (BP Location: Right Arm) Comment: states has not taken BP meds this morning  Pulse 66   Temp 98.1 F (36.7 C) (Oral)   Resp 18   SpO2 94%   BP Readings from Last 3 Encounters:  11/23/22 (!) 187/74  09/30/22 (!) 144/80  06/19/22 (!) 158/67       OTC symptom care as needed. Ensure adequate fluid intake and rest.   Follow-up Information     Carylon Perches, MD.   Specialty: Internal Medicine Why: Keep your follow up appointment. Contact information: [address] [place] [address] [phone]                Declines lab work; reports recent with PCP; arranged f/u.  Reviewed expectations re:  course of current medical issues. Questions answered. Outlined signs and symptoms indicating need for more acute intervention. Patient verbalized understanding. After Visit Summary given.   SUBJECTIVE: History from: patient.  Shane Ochoa is a 82 y.o. male who reports feeling dizzy (= lightheaded feeling) and weak x 1.5 weeks. States today is worse. With runny nose and nasal congestion.  Now with facial pain around eye area (maxillary sinuses). Denies fever/resp symptoms. No current lightheadedness. Denies vertigo.  Social History   Tobacco Use  Smoking Status Never  Smokeless Tobacco Never   Increased blood pressure noted today. Reports that he is treated for HTN. Denies CP/SOB.  OBJECTIVE:  Vitals:   11/23/22 1026  BP: (!) 187/74  Pulse: 66  Resp: 18  Temp: 98.1 F (36.7 C)  TempSrc: Oral  SpO2: 94%     General appearance: alert; no distress HEENT: nasal congestion; clear runny nose; throat irritation secondary to post-nasal drainage; bilateral maxillary tenderness to palpation; turbinates boggy Neck: supple without LAD; trachea midline CV: reg Lungs: unlabored respirations, symmetrical air entry; cough: absent; no respiratory distress Skin: warm and dry Psychological: alert and cooperative; normal mood and affect  Allergies  Allergen Reactions   Antihistamines, Chlorpheniramine-Type     Unable to take due to prostate.    Past Medical History:  Diagnosis Date  ALCOHOL ABUSE 03/31/2007   Qualifier: Diagnosis of  By: Maris Berger    ALCOHOLIC HEPATITIS, HX OF 04/06/2007   Qualifier: Diagnosis of  By: Jonny Ruiz MD, Len Blalock    ALLERGIC RHINITIS 04/06/2007   Qualifier: Diagnosis of  By: Jonny Ruiz MD, Len Blalock    ANEMIA-NOS 08/07/2007   Qualifier: Diagnosis of  By: Jonny Ruiz MD, Len Blalock    ANXIETY 03/31/2007   Qualifier: Diagnosis of  By: Maris Berger    Anxiety and depression    ATRIAL FIBRILLATION, PAROXYSMAL, HX OF 03/31/2007   Paroxysmal episode of  atrial fibrillation in the remote past     BENIGN PROSTATIC HYPERTROPHY 04/06/2007   Qualifier: Diagnosis of  By: Jonny Ruiz MD, Len Blalock    Cirrhosis of liver without mention of alcohol 08/02/2007   Qualifier: Diagnosis of  By: Yetta Barre RN, CGRN, Sheri     COLONIC POLYPS, ADENOMATOUS 08/02/2007   Qualifier: Diagnosis of  By: Yetta Barre RN, CGRN, Sheri     COLONIC POLYPS, HX OF 08/07/2007   Qualifier: Diagnosis of  By: Jonny Ruiz MD, Len Blalock    DEPRESSION 03/31/2007   Qualifier: Diagnosis of  By: Maris Berger    DIVERTICULOSIS, COLON 04/06/2007   Qualifier: Diagnosis of  By: Jonny Ruiz MD, Len Blalock    FREQUENCY, URINARY 08/07/2007   Qualifier: Diagnosis of  By: Jonny Ruiz MD, Len Blalock    GERD 08/07/2007   Qualifier: Diagnosis of  By: Jonny Ruiz MD, Len Blalock    HEMORRHOIDS, INTERNAL 08/02/2007   Qualifier: Diagnosis of  By: Yetta Barre RN, CGRN, Sheri     Hypertension    IBS 08/07/2007   Qualifier: Diagnosis of  By: Jonny Ruiz MD, Len Blalock    INGUINAL HERNIA, LEFT 03/31/2007   Qualifier: Diagnosis of  By: Maris Berger    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   Qualifier: Diagnosis of  By: Jonny Ruiz MD, Len Blalock    Personal history of urinary calculi 03/31/2007   Centricity Description: RENAL CALCULUS, HX OF Qualifier: Diagnosis of  By: Maris Berger  Centricity Description: NEPHROLITHIASIS, HX OF Qualifier: Diagnosis of  By: Jonny Ruiz MD, Len Blalock    RASH-NONVESICULAR 08/07/2007   Qualifier: Diagnosis of  By: Jonny Ruiz MD, Len Blalock    RECTAL BLEEDING 08/02/2007   Qualifier: Diagnosis of  By: Yetta Barre RN, CGRN, Sheri     THROMBOCYTOPENIA 04/06/2007   Qualifier: Diagnosis of  By: Jonny Ruiz MD, Len Blalock    Family History  Problem Relation Age of Onset   Heart disease Mother    Cancer Father    Social History   Socioeconomic History   Marital status: Married    Spouse name: Not on file   Number of children: 2   Years of education: Not on file   Highest education level: Not on file  Occupational History   Not on file  Tobacco Use   Smoking status:  Never   Smokeless tobacco: Never  Substance and Sexual Activity   Alcohol use: Yes    Comment: Occ   Drug use: No   Sexual activity: Not on file  Other Topics Concern   Not on file  Social History Narrative   Not on file   Social Determinants of Health   Financial Resource Strain: Not on file  Food Insecurity: Not on file  Transportation Needs: Not on file  Physical Activity: Not on file  Stress: Not on file  Social Connections: Not on file  Intimate Partner Violence: Not on file  Mardella Layman, MD 11/24/22 [phone]

## 2022-12-06 ENCOUNTER — Other Ambulatory Visit (HOSPITAL_COMMUNITY): Payer: Self-pay | Admitting: Internal Medicine

## 2022-12-06 DIAGNOSIS — D696 Thrombocytopenia, unspecified: Secondary | ICD-10-CM | POA: Diagnosis not present

## 2022-12-06 DIAGNOSIS — F325 Major depressive disorder, single episode, in full remission: Secondary | ICD-10-CM | POA: Diagnosis not present

## 2022-12-06 DIAGNOSIS — Z1289 Encounter for screening for malignant neoplasm of other sites: Secondary | ICD-10-CM

## 2022-12-06 DIAGNOSIS — I10 Essential (primary) hypertension: Secondary | ICD-10-CM | POA: Diagnosis not present

## 2022-12-06 DIAGNOSIS — K7460 Unspecified cirrhosis of liver: Secondary | ICD-10-CM | POA: Diagnosis not present

## 2022-12-27 ENCOUNTER — Ambulatory Visit (HOSPITAL_COMMUNITY)
Admission: RE | Admit: 2022-12-27 | Discharge: 2022-12-27 | Disposition: A | Discharge status: Home / Self Care | Payer: Medicare Other | Source: Ambulatory Visit | Attending: Internal Medicine | Admitting: Internal Medicine

## 2022-12-27 DIAGNOSIS — Z1289 Encounter for screening for malignant neoplasm of other sites: Secondary | ICD-10-CM | POA: Diagnosis not present

## 2022-12-27 DIAGNOSIS — K8020 Calculus of gallbladder without cholecystitis without obstruction: Secondary | ICD-10-CM | POA: Diagnosis not present

## 2022-12-28 DIAGNOSIS — H35371 Puckering of macula, right eye: Secondary | ICD-10-CM | POA: Diagnosis not present

## 2022-12-28 DIAGNOSIS — H43811 Vitreous degeneration, right eye: Secondary | ICD-10-CM | POA: Diagnosis not present

## 2022-12-28 DIAGNOSIS — H353134 Nonexudative age-related macular degeneration, bilateral, advanced atrophic with subfoveal involvement: Secondary | ICD-10-CM | POA: Diagnosis not present

## 2023-01-02 ENCOUNTER — Other Ambulatory Visit (HOSPITAL_COMMUNITY): Payer: Self-pay | Admitting: Internal Medicine

## 2023-01-02 DIAGNOSIS — D214 Benign neoplasm of connective and other soft tissue of abdomen: Secondary | ICD-10-CM

## 2023-01-06 DIAGNOSIS — H353134 Nonexudative age-related macular degeneration, bilateral, advanced atrophic with subfoveal involvement: Secondary | ICD-10-CM | POA: Diagnosis not present

## 2023-01-26 ENCOUNTER — Ambulatory Visit (HOSPITAL_COMMUNITY)
Admission: RE | Admit: 2023-01-26 | Discharge: 2023-01-26 | Disposition: A | Discharge status: Home / Self Care | Payer: Medicare Other | Source: Ambulatory Visit | Attending: Internal Medicine | Admitting: Internal Medicine

## 2023-01-26 DIAGNOSIS — R935 Abnormal findings on diagnostic imaging of other abdominal regions, including retroperitoneum: Secondary | ICD-10-CM | POA: Diagnosis not present

## 2023-01-26 DIAGNOSIS — R1011 Right upper quadrant pain: Secondary | ICD-10-CM | POA: Diagnosis not present

## 2023-01-26 DIAGNOSIS — K7460 Unspecified cirrhosis of liver: Secondary | ICD-10-CM | POA: Diagnosis not present

## 2023-01-26 DIAGNOSIS — D214 Benign neoplasm of connective and other soft tissue of abdomen: Secondary | ICD-10-CM | POA: Diagnosis not present

## 2023-01-26 DIAGNOSIS — K8020 Calculus of gallbladder without cholecystitis without obstruction: Secondary | ICD-10-CM | POA: Diagnosis not present

## 2023-01-26 MED ORDER — GADOBUTROL 1 MMOL/ML IV SOLN
7.0000 mL | Freq: Once | INTRAVENOUS | Status: AC | PRN
  Administered 2023-01-26: 7 mL via INTRAVENOUS

## 2023-01-29 IMAGING — US US ABDOMEN LIMITED
1 series · 14 of 25 positions shown · non-contrast
Comparison: Right upper quadrant ultrasound March 13, 2020

CLINICAL DATA: History of cirrhosis. Evaluate for hepatocellular
carcinoma.

EXAM:
ULTRASOUND ABDOMEN LIMITED RIGHT UPPER QUADRANT

[Series 1: us abdomen limited ruq (liver/gb) · 14 of 61 slices shown]
[im 1/61]
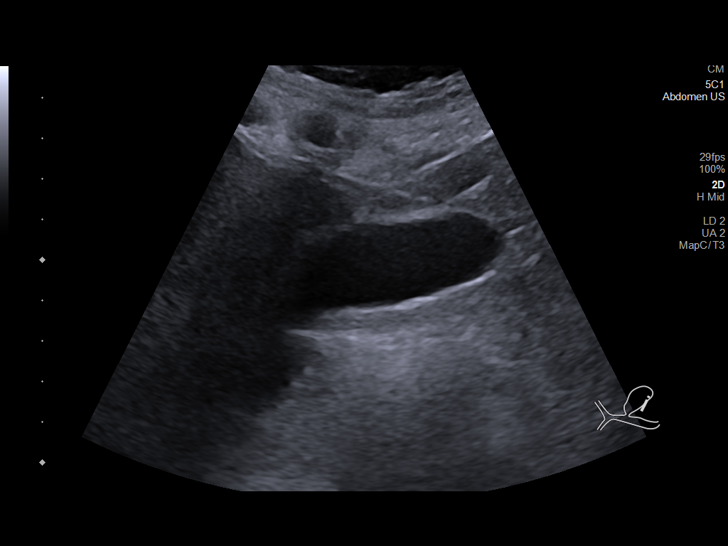
[im 6/61]
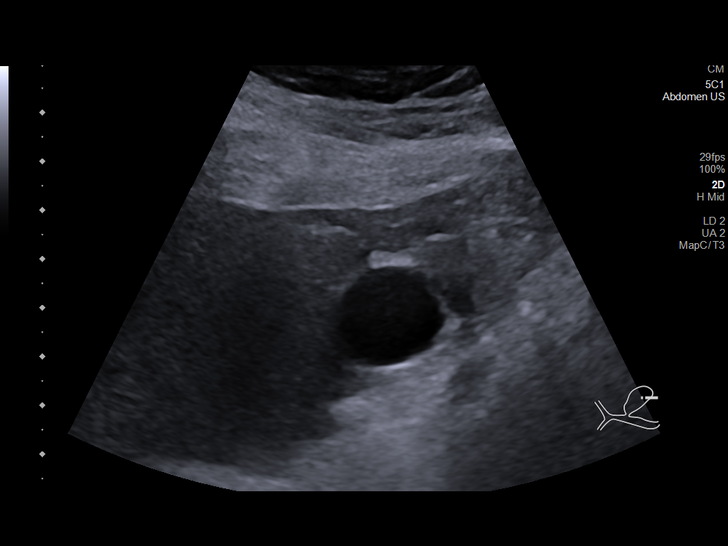
[im 11/61]
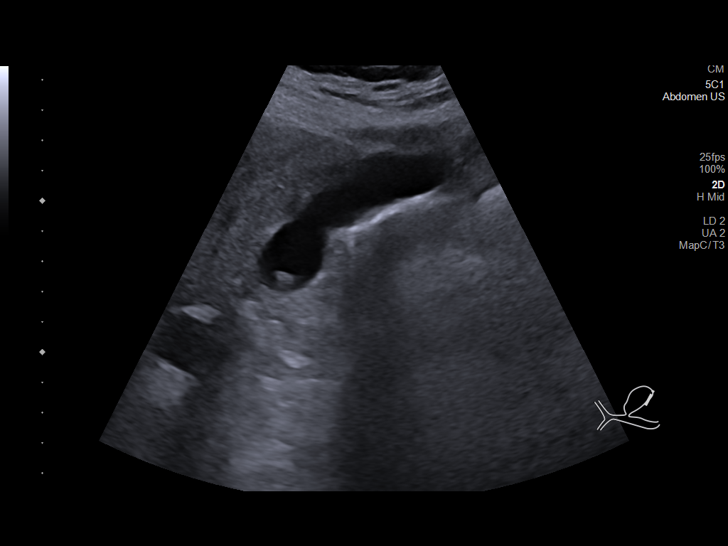
[im 16/61]
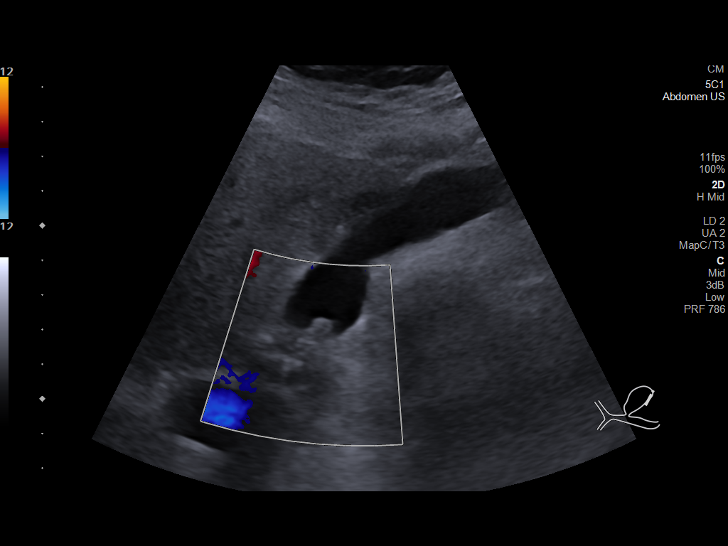
[im 21/61]
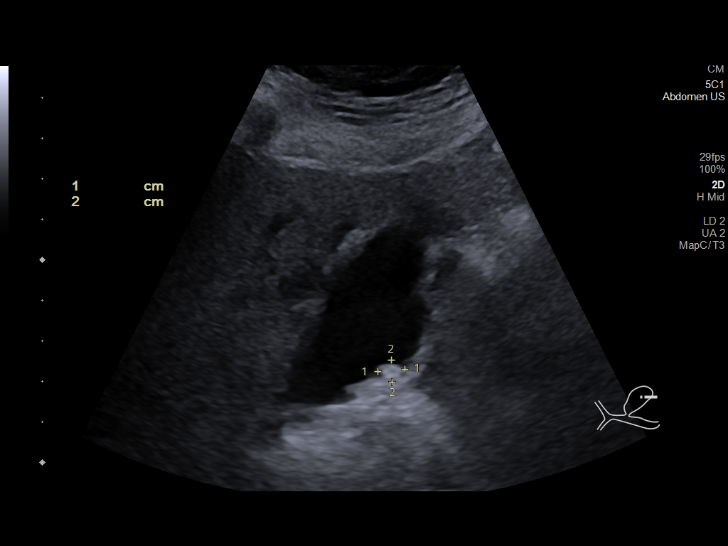
[im 23/61]
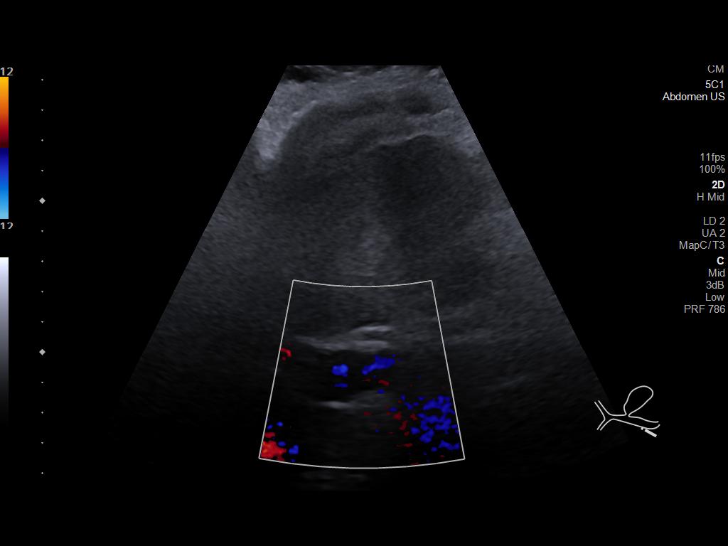
[im 28/61]
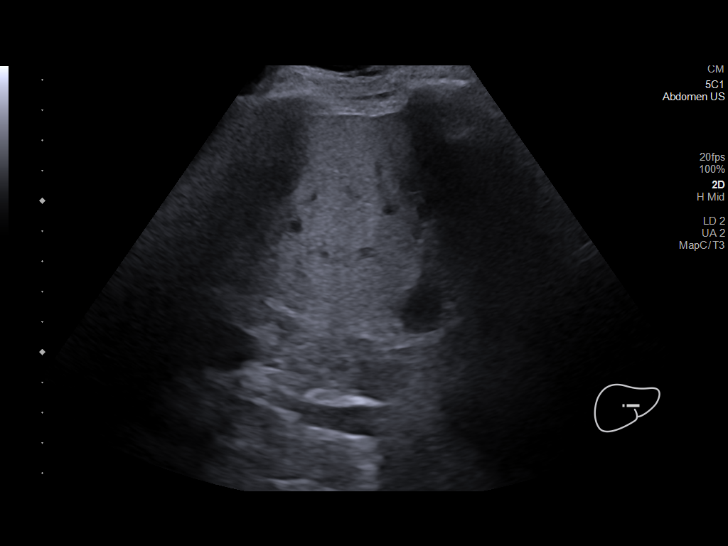
[im 33/61]
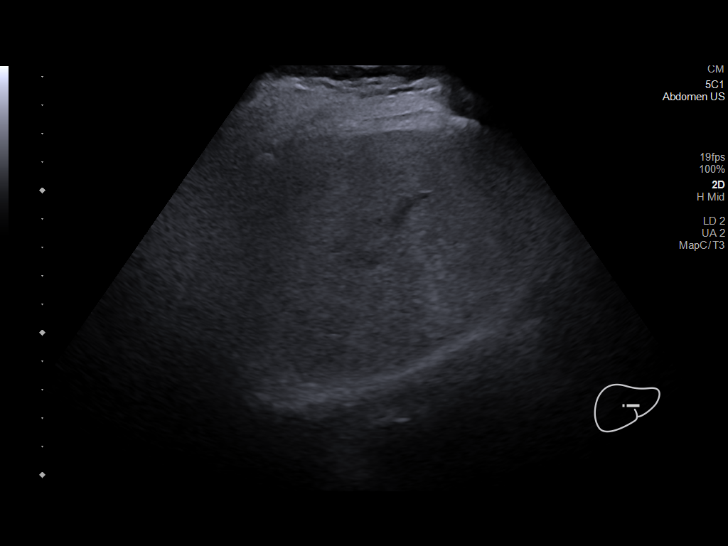
[im 38/61]
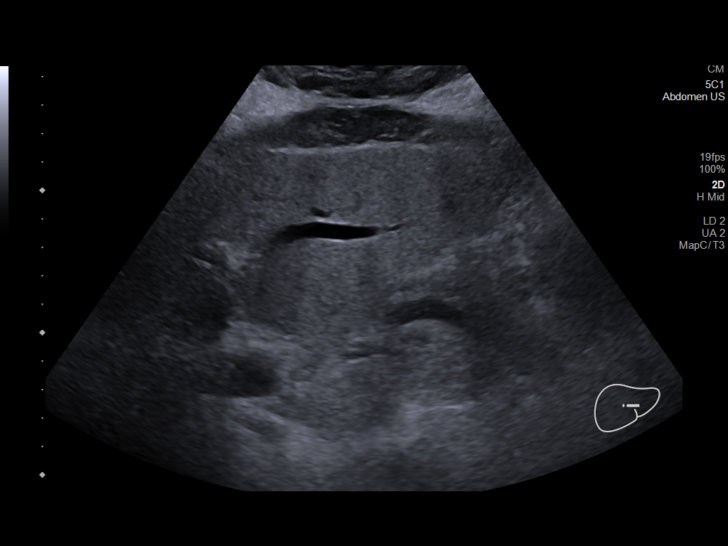
[im 41/61]
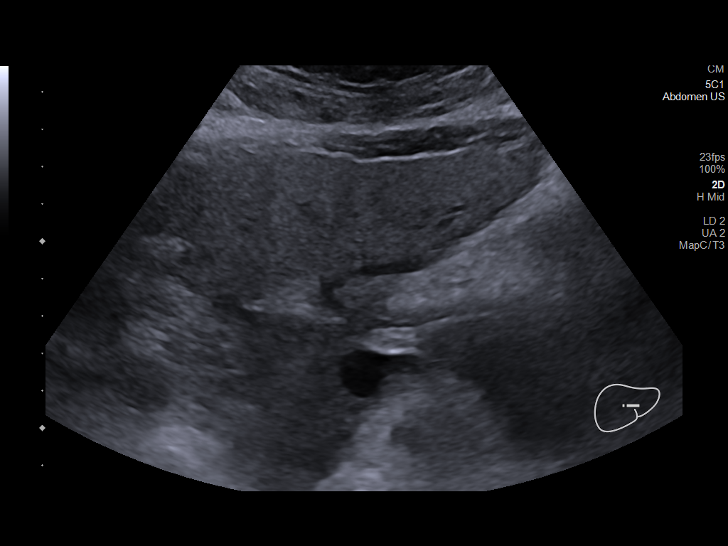
[im 46/61]
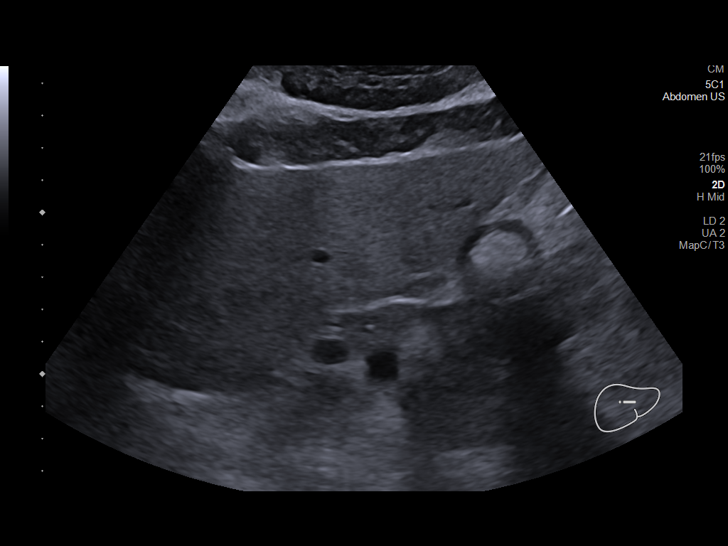
[im 51/61]
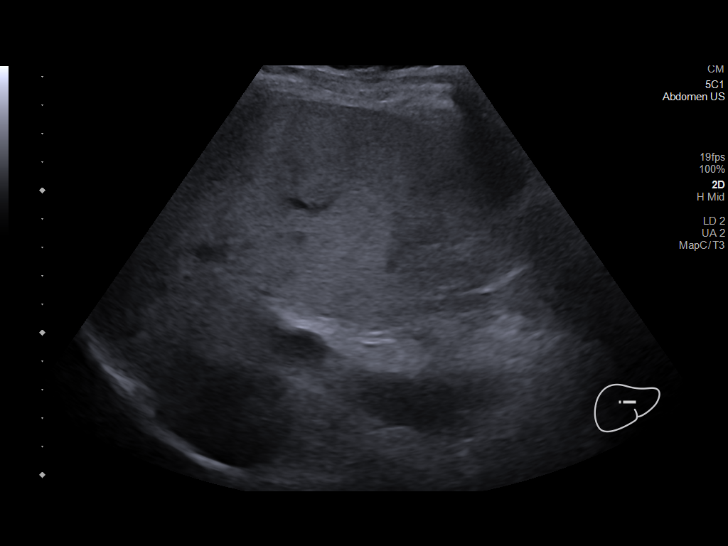
[im 56/61]
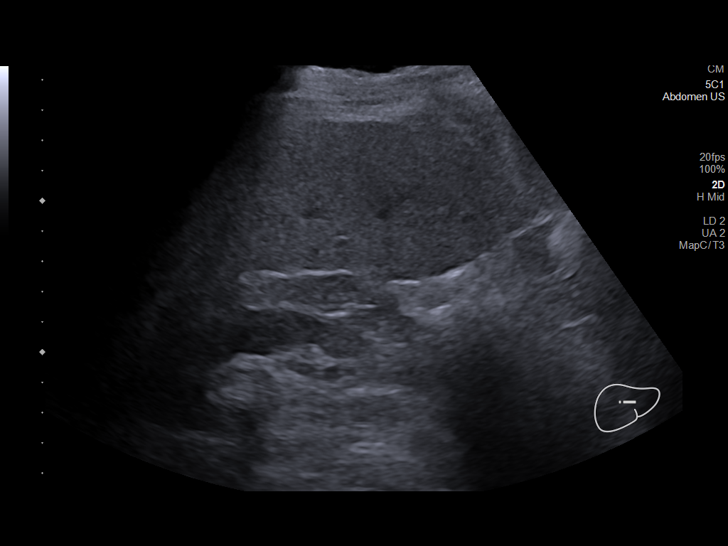
[im 61/61]
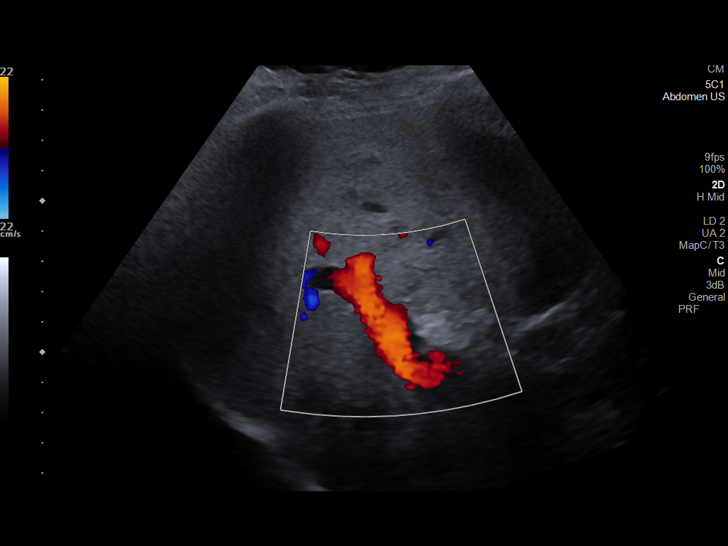

[14 of 25 positions shown; findings below may reference images not displayed]

FINDINGS: Gallbladder:

Contains a 7.4 mm polyp, not significantly changed in the interval.
In fact, this polyp measured 9 mm on the March 01, 2019 study. No
wall thickening, Murphy's sign, shadowing stones, or pericholecystic
fluid.

Common bile duct:

Diameter: 4.5 mm

Liver:

Diffuse increased echogenicity. Nodular liver contour again
identified. No hepatic masses noted. Diffuse increased echogenicity
is identified. Portal vein is patent on color Doppler imaging with
normal direction of blood flow towards the liver.

Other: None.
IMPRESSION: 1. There is a 7.4 mm polyp in the gallbladder, not significantly
changed since February 2019. Recommend continued attention on annual
follow-up. The gallbladder is otherwise normal in appearance.
2. Cirrhotic changes in the liver again identified. No suspicious
masses are identified.

## 2023-01-31 ENCOUNTER — Telehealth: Payer: Self-pay | Admitting: Internal Medicine

## 2023-01-31 NOTE — Telephone Encounter (Signed)
Bonita Quin, I spoke to this patient's PCP re an appointment. Have him come in for AFP, CBC, CMET, PT/INR, chronic hep B and C serologies. Set him up for a visit with me or APP. Thanks, Dr. Demetrius Charity

## 2023-01-31 NOTE — Telephone Encounter (Signed)
Dr. Carylon Perches is calling to have a doctor to doctor meeting.  Please call [phone].

## 2023-02-01 ENCOUNTER — Other Ambulatory Visit: Payer: Self-pay

## 2023-02-01 DIAGNOSIS — K589 Irritable bowel syndrome without diarrhea: Secondary | ICD-10-CM

## 2023-02-01 NOTE — Telephone Encounter (Signed)
Lab orders in epic. Pt scheduled to see Dr. Marina Goodell 02/22/23 at 3:40pm. Left message for pt to call back.

## 2023-02-01 NOTE — Telephone Encounter (Signed)
Left message for pt to call back regarding appt. 

## 2023-02-03 NOTE — Telephone Encounter (Signed)
Called patient to notify of appt with Dr. Marina Goodell on 02/22/23 at 3:40pm. Patient was also notified of labs due and given the address of location for both the lab and OV. Patient informed the nurse to call and give the same information to his daughter Jonnathan Birman) at [phone]. Ms Hilger called and message left on her VM. Permission given via Mr. Bexley Mclester. Patient was also informed to have labs drawn prior to OV. Hours of operation also given, with no appt needed.

## 2023-02-07 ENCOUNTER — Other Ambulatory Visit (INDEPENDENT_AMBULATORY_CARE_PROVIDER_SITE_OTHER): Payer: Medicare Other

## 2023-02-07 DIAGNOSIS — K589 Irritable bowel syndrome without diarrhea: Secondary | ICD-10-CM | POA: Diagnosis not present

## 2023-02-07 LAB — COMPREHENSIVE METABOLIC PANEL
ALT: 54 U/L — ABNORMAL HIGH (ref 0–53)
AST: 54 U/L — ABNORMAL HIGH (ref 0–37)
Albumin: 3.9 g/dL (ref 3.5–5.2)
Alkaline Phosphatase: 74 U/L (ref 39–117)
BUN: 13 mg/dL (ref 6–23)
CO2: 29 mEq/L (ref 19–32)
Calcium: 9.1 mg/dL (ref 8.4–10.5)
Chloride: 103 mEq/L (ref 96–112)
Creatinine, Ser: 0.91 mg/dL (ref 0.40–1.50)
GFR: 78.86 mL/min (ref >60.00)
Glucose, Bld: 87 mg/dL (ref 70–99)
Potassium: 4.2 mEq/L (ref 3.5–5.1)
Sodium: 140 mEq/L (ref 135–145)
Total Bilirubin: 0.6 mg/dL (ref 0.2–1.2)
Total Protein: 6.5 g/dL (ref 6.0–8.3)

## 2023-02-07 LAB — CBC WITH DIFFERENTIAL/PLATELET
Basophils Absolute: 0 10*3/uL (ref 0.0–0.1)
Basophils Relative: 1.3 % (ref 0.0–3.0)
Eosinophils Absolute: 0.1 10*3/uL (ref 0.0–0.7)
Eosinophils Relative: 2.9 % (ref 0.0–5.0)
HCT: 43.2 % (ref 39.0–52.0)
Hemoglobin: 14.3 g/dL (ref 13.0–17.0)
Lymphocytes Relative: 19.5 % (ref 12.0–46.0)
Lymphs Abs: 0.6 10*3/uL — ABNORMAL LOW (ref 0.7–4.0)
MCHC: 33.2 g/dL (ref 30.0–36.0)
MCV: 96.1 fl (ref 78.0–100.0)
Monocytes Absolute: 0.3 10*3/uL (ref 0.1–1.0)
Monocytes Relative: 11.2 % (ref 3.0–12.0)
Neutro Abs: 1.9 10*3/uL (ref 1.4–7.7)
Neutrophils Relative %: 65.1 % (ref 43.0–77.0)
Platelets: 106 10*3/uL — ABNORMAL LOW (ref 150.0–400.0)
RBC: 4.49 Mil/uL (ref 4.22–5.81)
RDW: 14.4 % (ref 11.5–15.5)
WBC: 3 10*3/uL — ABNORMAL LOW (ref 4.0–10.5)

## 2023-02-07 LAB — PROTIME-INR
INR: 1.1 ratio — ABNORMAL HIGH (ref 0.8–1.0)
Prothrombin Time: 11.7 s (ref 9.6–13.1)

## 2023-02-08 LAB — HEPATITIS B SURFACE ANTIBODY,QUALITATIVE: Hep B S Ab: NONREACTIVE

## 2023-02-08 LAB — HEPATITIS B SURFACE ANTIGEN: Hepatitis B Surface Ag: NONREACTIVE

## 2023-02-08 LAB — AFP TUMOR MARKER: AFP-Tumor Marker: 13 ng/mL — ABNORMAL HIGH (ref <6.1)

## 2023-02-08 LAB — HEPATITIS B CORE ANTIBODY, TOTAL: Hep B Core Total Ab: NONREACTIVE

## 2023-02-08 LAB — HEPATITIS C ANTIBODY: Hepatitis C Ab: NONREACTIVE

## 2023-02-22 ENCOUNTER — Ambulatory Visit (INDEPENDENT_AMBULATORY_CARE_PROVIDER_SITE_OTHER): Payer: Medicare Other | Admitting: Internal Medicine

## 2023-02-22 ENCOUNTER — Encounter: Payer: Self-pay | Admitting: Internal Medicine

## 2023-02-22 VITALS — BP 144/80 | HR 60 | Ht 67.0 in | Wt 179.0 lb

## 2023-02-22 DIAGNOSIS — R932 Abnormal findings on diagnostic imaging of liver and biliary tract: Secondary | ICD-10-CM

## 2023-02-22 DIAGNOSIS — C220 Liver cell carcinoma: Secondary | ICD-10-CM | POA: Diagnosis not present

## 2023-02-22 DIAGNOSIS — K7030 Alcoholic cirrhosis of liver without ascites: Secondary | ICD-10-CM

## 2023-02-22 NOTE — Progress Notes (Signed)
HISTORY OF PRESENT ILLNESS:  Shane Ochoa is a 82 y.o. male, with past medical history as listed below, who is seen today by his primary care provider regarding abnormal hepatic imaging with concerns over cirrhosis and hepatocellular carcinoma.  He is accompanied today by 2 of his daughters.  I saw patient 20 years ago for colonoscopy in 2007 and upper endoscopy in 2008.  Patient has had longstanding problems with alcohol abuse.  Earlier this year he underwent CT scan of the abdomen and pelvis with contrast September 30, 2022.  He was said to have changes in his liver suspicious for cirrhosis.  He was noted to have a left hepatic lobe hypodensity of 8 mm which was stable for over 10 years.  No other liver lesions noted on that exam.  He subsequently underwent abdominal ultrasound December 22, 2022 for "screening for malignant neoplasm".  He was said to have a 4.4 x 2.9 x 3.7 cm hypoechoic mass in the right lobe of the liver.  Because of this finding, MRI of the liver was performed January 26, 2023.  He was again said to have changes consistent with steatosis.  In addition, 3.1 x 3.1 cm lesion in the right liver (segment 6) with MRI characteristics diagnostic for hepatocellular carcinoma.  At that time I was contacted by his PCP who asked me to see him regarding this issue.  I asked the patient to come in for blood work.  This was performed February 07, 2023.  Comprehensive metabolic panel was normal except for AST of 54 and ALT of 54.  CBC revealed platelet count of 106,000 white blood cell count of 3.0.  Normal hemoglobin 14.3.  His INR was 1.1.  AFP slightly elevated at 13.  Patient has no GI complaints.  He continues to use alcohol (3-4 beers per day).  REVIEW OF SYSTEMS:  All non-GI ROS negative unless otherwise stated in HPI except for back pain  Past Medical History:  Diagnosis Date   ALCOHOL ABUSE 03/31/2007   Qualifier: Diagnosis of  By: Maris Berger    ALCOHOLIC HEPATITIS, HX OF 04/06/2007    Qualifier: Diagnosis of  By: Jonny Ruiz MD, Len Blalock    ALLERGIC RHINITIS 04/06/2007   Qualifier: Diagnosis of  By: Jonny Ruiz MD, Len Blalock    ANEMIA-NOS 08/07/2007   Qualifier: Diagnosis of  By: Jonny Ruiz MD, Len Blalock    ANXIETY 03/31/2007   Qualifier: Diagnosis of  By: Maris Berger    Anxiety and depression    ATRIAL FIBRILLATION, PAROXYSMAL, HX OF 03/31/2007   Paroxysmal episode of atrial fibrillation in the remote past     BENIGN PROSTATIC HYPERTROPHY 04/06/2007   Qualifier: Diagnosis of  By: Jonny Ruiz MD, Len Blalock    Cirrhosis of liver without mention of alcohol 08/02/2007   Qualifier: Diagnosis of  By: Yetta Barre RN, CGRN, Sheri     COLONIC POLYPS, ADENOMATOUS 08/02/2007   Qualifier: Diagnosis of  By: Yetta Barre RN, CGRN, Sheri     COLONIC POLYPS, HX OF 08/07/2007   Qualifier: Diagnosis of  By: Jonny Ruiz MD, Len Blalock    DEPRESSION 03/31/2007   Qualifier: Diagnosis of  By: Maris Berger    DIVERTICULOSIS, COLON 04/06/2007   Qualifier: Diagnosis of  By: Jonny Ruiz MD, Len Blalock    FREQUENCY, URINARY 08/07/2007   Qualifier: Diagnosis of  By: Jonny Ruiz MD, Len Blalock    GERD 08/07/2007   Qualifier: Diagnosis of  By: Jonny Ruiz MD, Len Blalock    HEMORRHOIDS, INTERNAL 08/02/2007  Qualifier: Diagnosis of  By: Yetta Barre RN, CGRN, Sheri     Hypertension    Hypertension    IBS 08/07/2007   Qualifier: Diagnosis of  By: Jonny Ruiz MD, Len Blalock    INGUINAL HERNIA, LEFT 03/31/2007   Qualifier: Diagnosis of  By: Maris Berger    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   Qualifier: Diagnosis of  By: Jonny Ruiz MD, Len Blalock    Personal history of urinary calculi 03/31/2007   Centricity Description: RENAL CALCULUS, HX OF Qualifier: Diagnosis of  By: Maris Berger  Centricity Description: NEPHROLITHIASIS, HX OF Qualifier: Diagnosis of  By: Jonny Ruiz MD, Len Blalock    RASH-NONVESICULAR 08/07/2007   Qualifier: Diagnosis of  By: Jonny Ruiz MD, Len Blalock    RECTAL BLEEDING 08/02/2007   Qualifier: Diagnosis of  By: Yetta Barre RN, CGRN, Sheri     THROMBOCYTOPENIA  04/06/2007   Qualifier: Diagnosis of  By: Jonny Ruiz MD, Len Blalock     Past Surgical History:  Procedure Laterality Date   HERNIA REPAIR      Social History Cree E Reffett  reports that he has never smoked. He has never used smokeless tobacco. He reports current alcohol use. He reports that he does not use drugs.  family history includes Cancer in his father; Heart disease in his mother.  Allergies  Allergen Reactions   Antihistamines, Chlorpheniramine-Type     Unable to take due to prostate.       PHYSICAL EXAMINATION: Vital signs: BP (!) 144/80   Pulse 60   Ht 5\' 7"  (1.702 m)   Wt 179 lb (81.2 kg)   BMI 28.04 kg/m   Constitutional: generally well-appearing, no acute distress Psychiatric: alert and oriented x3, cooperative Eyes: extraocular movements intact, anicteric, conjunctiva pink Mouth: oral pharynx moist, no lesions Neck: supple no lymphadenopathy Cardiovascular: heart regular rate and rhythm, no murmur Lungs: clear to auscultation bilaterally Abdomen: soft, nontender, nondistended, no obvious ascites, no peritoneal signs, normal bowel sounds, no organomegaly Rectal: Omitted Extremities: no clubbing, cyanosis, or lower extremity edema bilaterally.  Changes of arthritis in the hands Skin: no lesions on visible extremities Neuro: No focal deficits. No asterixis.    ASSESSMENT:  1.  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  Mildly elevated AFP.  Asymptomatic 2.  Alcoholic cirrhosis.  Compensated.  Low MELD score (MELD score of 7). 3.  General Medical problems.  Stable   PLAN:  1.  I reviewed with them today all of his data in detail. 2.  Multiple questions answered 3.  Refer to oncology to discuss management strategies of his peripheral HCC  Total time of 60 minutes was spent preparing to see the patient, reviewing the myriad of data, ordering laboratories, obtaining comprehensive history, performing medically appropriate physical examination, counseling the  patient and his daughter regarding the above listed issue, answering questions, directed oncology referral, and documenting clinical information in the health record.

## 2023-02-22 NOTE — Patient Instructions (Signed)
_______________________________________________________  If your blood pressure at your visit was 140/90 or greater, please contact your primary care physician to follow up on this.  _______________________________________________________  If you are age 82 or older, your body mass index should be between 23-30. Your Body mass index is 28.04 kg/m. If this is out of the aforementioned range listed, please consider follow up with your Primary Care Provider.  If you are age 50 or younger, your body mass index should be between 19-25. Your Body mass index is 28.04 kg/m. If this is out of the aformentioned range listed, please consider follow up with your Primary Care Provider.   ________________________________________________________  The [facility] providers would like to encourage you to use [facility] to communicate with providers for non-urgent requests or questions.  Due to long hold times on the telephone, sending your provider a message by [facility] may be a faster and more efficient way to get a response.  Please allow 48 business hours for a response.  Please remember that this is for non-urgent requests.  _______________________________________________________  The cancer center will call you to schedule an appointment

## 2023-03-02 ENCOUNTER — Other Ambulatory Visit: Payer: Self-pay

## 2023-03-02 ENCOUNTER — Inpatient Hospital Stay: Payer: Medicare Other

## 2023-03-02 ENCOUNTER — Inpatient Hospital Stay: Payer: Medicare Other | Attending: Nurse Practitioner | Admitting: Nurse Practitioner

## 2023-03-02 ENCOUNTER — Encounter: Payer: Self-pay | Admitting: Nurse Practitioner

## 2023-03-02 VITALS — BP 130/70 | HR 69 | Temp 98.0°F | Wt 179.9 lb

## 2023-03-02 DIAGNOSIS — K589 Irritable bowel syndrome without diarrhea: Secondary | ICD-10-CM | POA: Insufficient documentation

## 2023-03-02 DIAGNOSIS — G8929 Other chronic pain: Secondary | ICD-10-CM | POA: Diagnosis not present

## 2023-03-02 DIAGNOSIS — F1090 Alcohol use, unspecified, uncomplicated: Secondary | ICD-10-CM | POA: Insufficient documentation

## 2023-03-02 DIAGNOSIS — Z8052 Family history of malignant neoplasm of bladder: Secondary | ICD-10-CM | POA: Diagnosis not present

## 2023-03-02 DIAGNOSIS — K219 Gastro-esophageal reflux disease without esophagitis: Secondary | ICD-10-CM | POA: Diagnosis not present

## 2023-03-02 DIAGNOSIS — K7460 Unspecified cirrhosis of liver: Secondary | ICD-10-CM | POA: Insufficient documentation

## 2023-03-02 DIAGNOSIS — C220 Liver cell carcinoma: Secondary | ICD-10-CM | POA: Diagnosis not present

## 2023-03-02 DIAGNOSIS — I10 Essential (primary) hypertension: Secondary | ICD-10-CM | POA: Insufficient documentation

## 2023-03-02 DIAGNOSIS — M1990 Unspecified osteoarthritis, unspecified site: Secondary | ICD-10-CM | POA: Insufficient documentation

## 2023-03-02 DIAGNOSIS — Z79899 Other long term (current) drug therapy: Secondary | ICD-10-CM | POA: Insufficient documentation

## 2023-03-02 NOTE — Progress Notes (Addendum)
[facility]   Telephone:([phone]) [phone] Fax:(336) [phone]   Clinic New Consult Note   Patient Care Team: Carylon Perches, MD as PCP - General (Internal Medicine) 03/02/2023  CHIEF COMPLAINTS/PURPOSE OF CONSULTATION:  Hepatocellular carcinoma, referred by GI Dr. Yancey Flemings  Oncology History  Hepatocellular carcinoma [facility])  12/27/2022 Imaging   ABD US IMPRESSION: 1. There is a 4.4 cm hypoechoic mass within the right hepatic lobe. Recommend further evaluation with pre and post contrast-enhanced abdominal MRI. 2. Cholelithiasis without sonographic evidence for acute cholecystitis. 3. Increased hepatic parenchymal echogenicity suggestive of steatosis.      01/26/2023 Imaging   MRI IMPRESSION: 1. In the inferior tip of the right lobe of the liver, hepatic segment VI, there is a subcapsular mass measuring 3.1 x 3.1 cm, with heterogeneous arterial hyperenhancement and clear evidence of both capsular enhancement or washout on later contrast phases. This is consistent with hepatocellular carcinoma, LI-RADS category 5. 2. No evidence of lymphadenopathy or metastatic disease in the abdomen. 3. Hepatic steatosis. Mildly coarse contour of the liver, consistent with cirrhosis. No ascites. 4. Cholelithiasis.   02/07/2023 Tumor Marker   AFP: 13   03/02/2023 Initial Diagnosis   Hepatocellular carcinoma (HCC)   03/02/2023 Cancer Staging   Staging form: Liver, AJCC 8th Edition - Clinical: cT1b, cN0 - Signed by Pollyann Samples, NP on 03/02/2023      HISTORY OF PRESENTING ILLNESS:  Shane Ochoa 82 y.o. male with PMH including arthritis, HTN, GERD, IBS, isolated Afib episodes in 2022, alcohol abuse, and cirrhosis is here because of newly diagnosed hepatocellular carcinoma.  Had been getting routine abdominal ultrasound screenings. Earlier this year he developed n/v/d and presented to ED, CT AP 09/30/2022 reported gastroenteritis and a subtle hypodense left liver lobe lesion 8 mm which  has been stable over 10 years. His annual screening ABD ultrasound 12/27/2022 detected a 4.4 x 2.9 x 3.7 cm hypoechoic mass in the right liver.  Follow-up MRI 01/26/23 showed a subcapsular mass measuring 3.1 cm with heterogeneous arterial hyperenhancement and washout on delayed contrast phases consistent with LI-RADS 5 lesion and diagnostic for HCC.  AFP elevated to 13.  Socially, he is widowed, wife passed in April of this year.  He has 2 healthy daughters.  He lives alone independently on his property, is a Visual merchandiser, and very active.  He denies tobacco or drug use.  Has been drinking alcohol since age 41, a self-reported alcoholic but recovered somewhat.  Still drinks 4-5 beers daily. No plans to quit. Family history positive for father with bladder cancer.   Today he presents with his 2 daughters. Feels well, good energy and appetite.    Bowels move 3 times a day. Denies unintentional weight loss, abdominal pain/bloating, n/v, bleeding.  He has chronic back pain from arthritis.   MEDICAL HISTORY:  Past Medical History:  Diagnosis Date   ALCOHOL ABUSE 03/31/2007   Qualifier: Diagnosis of  By: Maris Berger    ALCOHOLIC HEPATITIS, HX OF 04/06/2007   Qualifier: Diagnosis of  By: Jonny Ruiz MD, Len Blalock    ALLERGIC RHINITIS 04/06/2007   Qualifier: Diagnosis of  By: Jonny Ruiz MD, Len Blalock    ANEMIA-NOS 08/07/2007   Qualifier: Diagnosis of  By: Jonny Ruiz MD, Len Blalock    ANXIETY 03/31/2007   Qualifier: Diagnosis of  By: Maris Berger    Anxiety and depression    ATRIAL FIBRILLATION, PAROXYSMAL, HX OF 03/31/2007   Paroxysmal episode of atrial fibrillation in the remote past  BENIGN PROSTATIC HYPERTROPHY 04/06/2007   Qualifier: Diagnosis of  By: Jonny Ruiz MD, Len Blalock    Cataract    bilateral   Cirrhosis of liver without mention of alcohol 08/02/2007   Qualifier: Diagnosis of  By: Yetta Barre RN, CGRN, Sheri     COLONIC POLYPS, ADENOMATOUS 08/02/2007   Qualifier: Diagnosis of  By: Yetta Barre RN, CGRN, Sheri      COLONIC POLYPS, HX OF 08/07/2007   Qualifier: Diagnosis of  By: Jonny Ruiz MD, Len Blalock    DEPRESSION 03/31/2007   Qualifier: Diagnosis of  By: Maris Berger    DIVERTICULOSIS, COLON 04/06/2007   Qualifier: Diagnosis of  By: Jonny Ruiz MD, Len Blalock    FREQUENCY, URINARY 08/07/2007   Qualifier: Diagnosis of  By: Jonny Ruiz MD, Len Blalock    GERD 08/07/2007   Qualifier: Diagnosis of  By: Jonny Ruiz MD, Len Blalock    HEMORRHOIDS, INTERNAL 08/02/2007   Qualifier: Diagnosis of  By: Yetta Barre RN, CGRN, Sheri     Hypertension    Hypertension    IBS 08/07/2007   Qualifier: Diagnosis of  By: Jonny Ruiz MD, Len Blalock    INGUINAL HERNIA, LEFT 03/31/2007   Qualifier: Diagnosis of  By: Maris Berger    Macular degeneration    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   Qualifier: Diagnosis of  By: Jonny Ruiz MD, Len Blalock    Personal history of urinary calculi 03/31/2007   Centricity Description: RENAL CALCULUS, HX OF Qualifier: Diagnosis of  By: Maris Berger  Centricity Description: NEPHROLITHIASIS, HX OF Qualifier: Diagnosis of  By: Jonny Ruiz MD, Len Blalock    RASH-NONVESICULAR 08/07/2007   Qualifier: Diagnosis of  By: Jonny Ruiz MD, Len Blalock    RECTAL BLEEDING 08/02/2007   Qualifier: Diagnosis of  By: Yetta Barre RN, CGRN, Sheri     THROMBOCYTOPENIA 04/06/2007   Qualifier: Diagnosis of  By: Jonny Ruiz MD, Len Blalock     SURGICAL HISTORY: Past Surgical History:  Procedure Laterality Date   CATARACT EXTRACTION Bilateral    INGUINAL HERNIA REPAIR Right     SOCIAL HISTORY: Social History   Socioeconomic History   Marital status: Widowed    Spouse name: Not on file   Number of children: 2   Years of education: Not on file   Highest education level: Not on file  Occupational History   Occupation: farmer  Tobacco Use   Smoking status: Never   Smokeless tobacco: Never  Vaping Use   Vaping status: Never Used  Substance and Sexual Activity   Alcohol use: Yes    Comment: since age 26, heavy at times, currently 3-5 beers/day   Drug use: No    Sexual activity: Not on file  Other Topics Concern   Not on file  Social History Narrative   Not on file   Social Determinants of Health   Financial Resource Strain: Not on file  Food Insecurity: No Food Insecurity (03/02/2023)   Hunger Vital Sign    Worried About Running Out of Food in the Last Year: Never true    Ran Out of Food in the Last Year: Never true  Transportation Needs: No Transportation Needs (03/02/2023)   PRAPARE - Administrator, Civil Service (Medical): No    Lack of Transportation (Non-Medical): No  Physical Activity: Not on file  Stress: Not on file  Social Connections: Not on file  Intimate Partner Violence: Not At Risk (03/02/2023)   Humiliation, Afraid, Rape, and Kick questionnaire    Fear of Current or Ex-Partner: No  Emotionally Abused: No    Physically Abused: No    Sexually Abused: No    FAMILY HISTORY: Family History  Problem Relation Age of Onset   Heart disease Mother    Bladder Cancer Father     ALLERGIES:  is allergic to antihistamines, chlorpheniramine-type.  MEDICATIONS:  Current Outpatient Medications  Medication Sig Dispense Refill   aspirin 81 MG tablet Take 81 mg by mouth daily.     escitalopram (LEXAPRO) 20 MG tablet Take 20 mg by mouth daily.     losartan-hydrochlorothiazide (HYZAAR) 100-25 MG per tablet Take 1 tablet by mouth daily.     Multiple Vitamins-Minerals (PRESERVISION AREDS 2+MULTI VIT PO) Take by mouth daily. 1 Tablet Daily     pantoprazole (PROTONIX) 40 MG tablet Take 40 mg by mouth daily.     Tamsulosin HCl (FLOMAX) 0.4 MG CAPS Take 0.4 mg by mouth daily.     zolpidem (AMBIEN) 10 MG tablet Take 5 mg by mouth at bedtime as needed. For sleep     No current facility-administered medications for this visit.    REVIEW OF SYSTEMS:   Constitutional: Denies fevers, chills or abnormal night sweats  Eyes: Denies blurriness of vision, double vision or watery eyes Ears, nose, mouth, throat, and face: Denies  mucositis or sore throat Respiratory: Denies cough, dyspnea or wheezes Cardiovascular: Denies palpitation, chest discomfort or lower extremity swelling Gastrointestinal:  Denies nausea, vomiting, constipation, pain, heartburn or change in bowel habits (+) frequent BM Skin: Denies abnormal skin rashes Lymphatics: Denies new lymphadenopathy or easy bruising Neurological:Denies numbness, tingling or new weaknesses (+) HOH Behavioral/Psych: Mood is stable, no new changes  All other systems were reviewed with the patient and are negative.  PHYSICAL EXAMINATION: ECOG PERFORMANCE STATUS: 0 - Asymptomatic  Vitals:   03/02/23 1220 03/02/23 1325  BP: (!) 130/32 130/70  Pulse: 69   Temp: 98 F (36.7 C)   SpO2: 95%    Filed Weights   03/02/23 1220  Weight: 179 lb 14.4 oz (81.6 kg)    GENERAL:alert, no distress and comfortable SKIN: no rashes or significant lesions EYES: sclera clear NECK: without mass LUNGS: clear with normal breathing effort HEART: abnormal rhythm, regular rate; no lower extremity edema ABDOMEN:abdomen soft, non-tender and normal bowel sounds Musculoskeletal: no cyanosis of digits and no clubbing  PSYCH: alert & oriented x 3 with fluent speech NEURO: no focal motor/sensory deficits  LABORATORY DATA:  I have reviewed the data as listed    Latest Ref Rng & Units 02/07/2023    9:50 AM 09/30/2022   11:41 AM 06/19/2022   11:59 AM  CBC  WBC 4.0 - 10.5 K/uL 3.0  3.3  2.5   Hemoglobin 13.0 - 17.0 g/dL 16.1  09.6  04.5   Hematocrit 39.0 - 52.0 % 43.2  47.9  40.3   Platelets 150.0 - 400.0 K/uL 106.0  108  69       Latest Ref Rng & Units 02/07/2023    9:50 AM 09/30/2022   11:41 AM 06/19/2022   11:59 AM  CMP  Glucose 70 - 99 mg/dL 87  409  811   BUN 6 - 23 mg/dL 13  13  9    Creatinine 0.40 - 1.50 mg/dL 9.14  7.82  9.56   Sodium 135 - 145 mEq/L 140  137  139   Potassium 3.5 - 5.1 mEq/L 4.2  3.2  4.0   Chloride 96 - 112 mEq/L 103  107  102   CO2 19 -  32 mEq/L 29  21   28    Calcium 8.4 - 10.5 mg/dL 9.1  8.6  8.7   Total Protein 6.0 - 8.3 g/dL 6.5  7.2    Total Bilirubin 0.2 - 1.2 mg/dL 0.6  0.6    Alkaline Phos 39 - 117 U/L 74  63    AST 0 - 37 U/L 54  116    ALT 0 - 53 U/L 54  93      RADIOGRAPHIC STUDIES: I have personally reviewed the radiological images as listed and agreed with the findings in the report. No results found.  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   -Found to have screening detected 3.1 cm LI-RADS 5 liver lesion with elevated AFP (13) which is consistent with HCC.  We are not recommending a biopsy.  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  -He is more interested in local therapy that would not take him long to recover.  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 Decoursey appears well with preserved liver function and very good performance status for his age.  He is asymptomatic -We discussed other treatment options including ablation, Y90, or external radiation/SBRT if staging CT is negative.  Seems to be leaning more towards radiation at this time but will discuss with his daughters -Will review his case in tumor board next week and call him with recommendations and refer him to specialists as needed -If he proceeds with IR guided therapy and they follow, we will see him as needed in the future; otherwise we will see him back for observation after treatment.  2. Cirrhosis -HCV negative; likely 2/2 to alcohol -Followed by Dr. Marina Goodell  3. Afib? -Had an isolated A-fib episode in  2022 while drinking alcohol, in NSR at cardiology visit -Not currently on anticoagulation -Appears to be in A-fib today, rate controlled, asymptomatic  -I recommend he follow-up with Dr. Jens Som.  I will CC my note -Continue aspirin for now  4. Alcohol use  -Using alcohol x60 years, heavy at time, currently 3-5 beers daily -No interested in quitting.  Strongly encouraged him to try to cut back    PLAN: -Medical record including imaging and lab reviewed -LI-RADS 5 and AFP 13, diagnostic for HCC, no biopsy needed; cT1bN0 -CT chest to complete staging -Discuss in tumor board next week, will call pt with recs and refer (IR vs rad onc) -Encouraged to cut back/quit alcohol, pt not interested -?Afib, cc cardiology, pt/daughter will call for an appointment. Cont ASA -Pt seen with Dr. Mosetta Putt    Orders Placed This Encounter  Procedures   CT Chest Wo Contrast    Standing Status:   Future    Standing Expiration Date:   03/01/2024    Order Specific Question:   Preferred imaging location?    Answer:   [id number]    All questions were answered. The patient knows to call the clinic with any problems, questions or concerns.     Pollyann Samples, NP 03/02/2023   Addendum  I have seen the patient, examined him. I agree with the assessment and and plan and have edited the notes.   82 yo male with PMH of alcohol liver cirrhosis, was referred for newly diagnosed HCC.  I have reviewed his CT and MRI of abdomen from March and July 2024, which showed a subcapsular mass measuring 3.1 cm, with MRI features consistent with hepatocellular carcinoma, his AFP is also elevated.  So no tissue biopsy is needed to confirm his diagnosis.  This lesion was vaguely visible on his CT scan from March 2024.  No other evidence of nodal or distant metastasis on his abdominal MRI.  Will obtain a CT chest to complete staging.  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  We discussed the option of liver targeted therapy, such as embolization or SBRT radiation.  His lesion is too big for microwave ablation.  He has good performance status and overall decent liver function, will be a candidate for liver targeted therapy.  I discussed the pros and cons of each treatment, he is more interested in radiation.  Will present his case in tumor conference next week, and refer him if he feels to be a candidate for SBRT.  I did not recommend medical treatment at this point, unless he has metastatic disease.  I recommend follow-up and surveillance after his definitive treatment for [facility], due to the high risk of recurrence or new HCC, he agrees.  All his questions were answered.  I spent a total of 45 minutes for his visit, more than 50% time on face-to-face counseling.  Malachy Mood MD 03/02/2023

## 2023-03-03 ENCOUNTER — Other Ambulatory Visit: Payer: Self-pay

## 2023-03-03 ENCOUNTER — Encounter: Payer: Self-pay | Admitting: Nurse Practitioner

## 2023-03-03 ENCOUNTER — Ambulatory Visit: Payer: Medicare Other | Admitting: Cardiology

## 2023-03-03 DIAGNOSIS — C220 Liver cell carcinoma: Secondary | ICD-10-CM

## 2023-03-05 NOTE — Progress Notes (Unsigned)
HPI: FU atrial fibrillation. Apparently had a nuclear study in the remote past that showed potential inferior ischemia but was treated medically.  Echocardiogram in 2003 showed normal LV function.  Abdominal ultrasound October 2015 showed no aneurysm.  Also with apparent episode of atrial fibrillation years ago with no recurrence.  Abdominal MRI July 2024 showed findings consistent with hepatocellular carcinoma but no evidence of metastatic disease, cirrhosis noted.  Since last seen, patient was seen in oncology clinic and felt possibly to be in atrial fibrillation based on physical exam.  No ECG or rhythm strip was performed per his family.  He denies dyspnea on exertion, orthopnea, PND, pedal edema, chest pain or syncope.  He does occasionally feel brief flutters that are not sustained.  Current Outpatient Medications  Medication Sig Dispense Refill   aspirin 81 MG tablet Take 81 mg by mouth daily.     escitalopram (LEXAPRO) 20 MG tablet Take 20 mg by mouth daily.     losartan-hydrochlorothiazide (HYZAAR) 100-25 MG per tablet Take 1 tablet by mouth daily.     Multiple Vitamins-Minerals (PRESERVISION AREDS 2+MULTI VIT PO) Take by mouth daily. 1 Tablet Daily     pantoprazole (PROTONIX) 40 MG tablet Take 40 mg by mouth daily.     Tamsulosin HCl (FLOMAX) 0.4 MG CAPS Take 0.4 mg by mouth daily.     zolpidem (AMBIEN) 10 MG tablet Take 5 mg by mouth at bedtime as needed. For sleep     No current facility-administered medications for this visit.     Past Medical History:  Diagnosis Date   ALCOHOL ABUSE 03/31/2007   Qualifier: Diagnosis of  By: Maris Berger    ALCOHOLIC HEPATITIS, HX OF 04/06/2007   Qualifier: Diagnosis of  By: Jonny Ruiz MD, Len Blalock    ALLERGIC RHINITIS 04/06/2007   Qualifier: Diagnosis of  By: Jonny Ruiz MD, Len Blalock    ANEMIA-NOS 08/07/2007   Qualifier: Diagnosis of  By: Jonny Ruiz MD, Len Blalock    ANXIETY 03/31/2007   Qualifier: Diagnosis of  By: Maris Berger     Anxiety and depression    ATRIAL FIBRILLATION, PAROXYSMAL, HX OF 03/31/2007   Paroxysmal episode of atrial fibrillation in the remote past     BENIGN PROSTATIC HYPERTROPHY 04/06/2007   Qualifier: Diagnosis of  By: Jonny Ruiz MD, Len Blalock    Cataract    bilateral   Cirrhosis of liver without mention of alcohol 08/02/2007   Qualifier: Diagnosis of  By: Yetta Barre RN, CGRN, Sheri     COLONIC POLYPS, ADENOMATOUS 08/02/2007   Qualifier: Diagnosis of  By: Yetta Barre RN, CGRN, Sheri     COLONIC POLYPS, HX OF 08/07/2007   Qualifier: Diagnosis of  By: Jonny Ruiz MD, Len Blalock    DEPRESSION 03/31/2007   Qualifier: Diagnosis of  By: Maris Berger    DIVERTICULOSIS, COLON 04/06/2007   Qualifier: Diagnosis of  By: Jonny Ruiz MD, Len Blalock    FREQUENCY, URINARY 08/07/2007   Qualifier: Diagnosis of  By: Jonny Ruiz MD, Len Blalock    GERD 08/07/2007   Qualifier: Diagnosis of  By: Jonny Ruiz MD, Len Blalock    HEMORRHOIDS, INTERNAL 08/02/2007   Qualifier: Diagnosis of  By: Yetta Barre RN, CGRN, Sheri     Hypertension    Hypertension    IBS 08/07/2007   Qualifier: Diagnosis of  By: Jonny Ruiz MD, Len Blalock    INGUINAL HERNIA, LEFT 03/31/2007   Qualifier: Diagnosis of  By: Maris Berger    Macular degeneration  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   Qualifier: Diagnosis of  By: Jonny Ruiz MD, Len Blalock    Personal history of urinary calculi 03/31/2007   Centricity Description: RENAL CALCULUS, HX OF Qualifier: Diagnosis of  By: Maris Berger  Centricity Description: NEPHROLITHIASIS, HX OF Qualifier: Diagnosis of  By: Jonny Ruiz MD, Len Blalock    RASH-NONVESICULAR 08/07/2007   Qualifier: Diagnosis of  By: Jonny Ruiz MD, Len Blalock    RECTAL BLEEDING 08/02/2007   Qualifier: Diagnosis of  By: Yetta Barre RN, CGRN, Sheri     THROMBOCYTOPENIA 04/06/2007   Qualifier: Diagnosis of  By: Jonny Ruiz MD, Len Blalock     Past Surgical History:  Procedure Laterality Date   CATARACT EXTRACTION Bilateral    INGUINAL HERNIA REPAIR Right     Social History   Socioeconomic History    Marital status: Widowed    Spouse name: Not on file   Number of children: 2   Years of education: Not on file   Highest education level: Not on file  Occupational History   Occupation: farmer  Tobacco Use   Smoking status: Never   Smokeless tobacco: Never  Vaping Use   Vaping status: Never Used  Substance and Sexual Activity   Alcohol use: Yes    Comment: since age 36, heavy at times, currently 3-5 beers/day   Drug use: No   Sexual activity: Not on file  Other Topics Concern   Not on file  Social History Narrative   Not on file   Social Determinants of Health   Financial Resource Strain: Not on file  Food Insecurity: No Food Insecurity (03/02/2023)   Hunger Vital Sign    Worried About Running Out of Food in the Last Year: Never true    Ran Out of Food in the Last Year: Never true  Transportation Needs: No Transportation Needs (03/02/2023)   PRAPARE - Administrator, Civil Service (Medical): No    Lack of Transportation (Non-Medical): No  Physical Activity: Not on file  Stress: Not on file  Social Connections: Not on file  Intimate Partner Violence: Not At Risk (03/02/2023)   Humiliation, Afraid, Rape, and Kick questionnaire    Fear of Current or Ex-Partner: No    Emotionally Abused: No    Physically Abused: No    Sexually Abused: No    Family History  Problem Relation Age of Onset   Heart disease Mother    Bladder Cancer Father     ROS: no fevers or chills, productive cough, hemoptysis, dysphasia, odynophagia, melena, hematochezia, dysuria, hematuria, rash, seizure activity, orthopnea, PND, pedal edema, claudication. Remaining systems are negative.  Physical Exam: Well-developed well-nourished in no acute distress.  Skin is warm and dry.  HEENT is normal.  Neck is supple.  Chest is clear to auscultation with normal expansion.  Cardiovascular exam is regular rate and rhythm.  Abdominal exam nontender or distended. No masses palpated. Extremities show  no edema. neuro grossly intact  EKG Interpretation Date/Time:  Monday March 06 2023 10:58:44 EDT Ventricular Rate:  66 PR Interval:  150 QRS Duration:  98 QT Interval:  396 QTC Calculation: 415 R Axis:   -29  Text Interpretation: Sinus with pacs and intermittent LBBB LAD Cannot R/O septal MI Confirmed by Olga Millers ([id number]) on 03/06/2023 11:01:40 AM    A/P  1 paroxysmal atrial fibrillation-patient had an episode of atrial fibrillation years ago in the setting of alcohol use.  He has remained in sinus with no documented recurrences and we have therefore  not anticoagulated. He was seen by oncology on August 15 and the notes state he appears to be in atrial fibrillation but this was based on physical exam without ECG or rhythm strip performed by his family report.  His electrocardiogram today shows sinus rhythm with PACs.  Given that he has not had documented atrial fibrillation we will not anticoagulate.  We discussed the possibility of recording rhythm strips with a smart watch in the future.  2 hypertension-patient's blood pressure is elevated; however typically controlled.  I will ask him to follow this at home and we will add medications as needed.  3 history of alcohol abuse-still consumes 4 beers daily.  I discussed limiting to 2.  4 recently diagnosed hepatocellular carcinoma-follow-up primary care and oncology.  5 palpitations-patient complains of brief palpitations.  Will repeat echocardiogram to reassess LV function.  Will consider addition of beta-blockade in the future if necessary.  Olga Millers, MD

## 2023-03-06 ENCOUNTER — Encounter: Payer: Self-pay | Admitting: Cardiology

## 2023-03-06 ENCOUNTER — Ambulatory Visit: Payer: Medicare Other | Attending: Cardiology | Admitting: Cardiology

## 2023-03-06 VITALS — BP 152/84 | HR 66 | Ht 67.0 in | Wt 180.0 lb

## 2023-03-06 DIAGNOSIS — R002 Palpitations: Secondary | ICD-10-CM | POA: Insufficient documentation

## 2023-03-06 DIAGNOSIS — R9430 Abnormal result of cardiovascular function study, unspecified: Secondary | ICD-10-CM | POA: Diagnosis not present

## 2023-03-06 DIAGNOSIS — I480 Paroxysmal atrial fibrillation: Secondary | ICD-10-CM | POA: Diagnosis not present

## 2023-03-06 NOTE — Patient Instructions (Signed)
  Testing/Procedures:  Your physician has requested that you have an echocardiogram. Echocardiography is a painless test that uses sound waves to create images of your heart. It provides your doctor with information about the size and shape of your heart and how well your heart's chambers and valves are working. This procedure takes approximately one hour. There are no restrictions for this procedure. Please do NOT wear cologne, perfume, aftershave, or lotions (deodorant is allowed). Please arrive 15 minutes prior to your appointment time. [address]   Follow-Up: At [facility], you and your health needs are our priority.  As part of our continuing mission to provide you with exceptional heart care, we have created designated Provider Care Teams.  These Care Teams include your primary Cardiologist (physician) and Advanced Practice Providers (APPs -  Physician Assistants and Nurse Practitioners) who all work together to provide you with the care you need, when you need it.  We recommend signing up for the patient portal called "MyChart".  Sign up information is provided on this After Visit Summary.  MyChart is used to connect with patients for Virtual Visits (Telemedicine).  Patients are able to view lab/test results, encounter notes, upcoming appointments, etc.  Non-urgent messages can be sent to your provider as well.   To learn more about what you can do with MyChart, go to [email or web address].    Your next appointment:   12 month(s)  Provider:   BRIAN CRENSHAW MD    

## 2023-03-08 ENCOUNTER — Telehealth: Payer: Self-pay | Admitting: Hematology

## 2023-03-08 ENCOUNTER — Other Ambulatory Visit: Payer: Self-pay | Admitting: *Deleted

## 2023-03-08 ENCOUNTER — Other Ambulatory Visit: Payer: Self-pay | Admitting: Hematology

## 2023-03-08 DIAGNOSIS — H43811 Vitreous degeneration, right eye: Secondary | ICD-10-CM | POA: Diagnosis not present

## 2023-03-08 DIAGNOSIS — H35371 Puckering of macula, right eye: Secondary | ICD-10-CM | POA: Diagnosis not present

## 2023-03-08 DIAGNOSIS — C220 Liver cell carcinoma: Secondary | ICD-10-CM

## 2023-03-08 DIAGNOSIS — H353134 Nonexudative age-related macular degeneration, bilateral, advanced atrophic with subfoveal involvement: Secondary | ICD-10-CM | POA: Diagnosis not present

## 2023-03-08 NOTE — Progress Notes (Signed)
The proposed treatment discussed in conference is for discussion purpose only and is not a binding recommendation.  The patients have not been physically examined, or presented with their treatment options.  Therefore, final treatment plans cannot be decided.  

## 2023-03-08 NOTE — Telephone Encounter (Signed)
Patient is was presented in GI tumor conference this morning.  We recommend radioembolization over SBRT radiation due to the location.  I called patient's daughter and discussed that the above recommendations.  Will refer patient to IR for consultation.  Patient's daughter agrees with the plan.  Shane Ochoa  03/08/2023

## 2023-03-09 ENCOUNTER — Encounter: Payer: Self-pay | Admitting: *Deleted

## 2023-03-09 DIAGNOSIS — C220 Liver cell carcinoma: Secondary | ICD-10-CM

## 2023-03-14 ENCOUNTER — Ambulatory Visit
Admission: RE | Admit: 2023-03-14 | Discharge: 2023-03-14 | Disposition: A | Discharge status: Home / Self Care | Payer: Medicare Other | Source: Ambulatory Visit | Attending: Nurse Practitioner | Admitting: Nurse Practitioner

## 2023-03-14 DIAGNOSIS — C220 Liver cell carcinoma: Secondary | ICD-10-CM

## 2023-03-14 DIAGNOSIS — K7460 Unspecified cirrhosis of liver: Secondary | ICD-10-CM | POA: Diagnosis not present

## 2023-03-14 DIAGNOSIS — I700 Atherosclerosis of aorta: Secondary | ICD-10-CM | POA: Diagnosis not present

## 2023-03-23 ENCOUNTER — Telehealth: Payer: Self-pay

## 2023-03-23 NOTE — Telephone Encounter (Addendum)
Called patient and relayed the message below as per Santiago Glad NP. Patients daughter voiced full understanding.    ----- Message from Pollyann Samples sent at 03/23/2023  8:22 AM EDT ----- Please let pt know CT chest is negative, no cancer metastasis. It did show incidental possible Civil left kidney stone. If he is having left flank pain please have him f/up with PCP.   Thanks, Clayborn Heron NP

## 2023-03-31 ENCOUNTER — Ambulatory Visit (HOSPITAL_COMMUNITY): Payer: Medicare Other | Attending: Cardiology

## 2023-03-31 DIAGNOSIS — R9430 Abnormal result of cardiovascular function study, unspecified: Secondary | ICD-10-CM | POA: Diagnosis not present

## 2023-03-31 DIAGNOSIS — R002 Palpitations: Secondary | ICD-10-CM | POA: Insufficient documentation

## 2023-03-31 DIAGNOSIS — I480 Paroxysmal atrial fibrillation: Secondary | ICD-10-CM | POA: Insufficient documentation

## 2023-03-31 LAB — ECHOCARDIOGRAM COMPLETE
AR max vel: 1.67 cm2
AV Area VTI: 1.67 cm2
AV Area mean vel: 1.68 cm2
AV Mean grad: 6.4 mmHg
AV Peak grad: 12 mmHg
Ao pk vel: 1.73 m/s
Area-P 1/2: 2.2 cm2
S' Lateral: 3.7 cm

## 2023-04-07 ENCOUNTER — Ambulatory Visit
Admission: RE | Admit: 2023-04-07 | Discharge: 2023-04-07 | Disposition: A | Discharge status: Home / Self Care | Payer: Medicare Other | Source: Ambulatory Visit | Attending: Hematology | Admitting: Hematology

## 2023-04-07 DIAGNOSIS — C220 Liver cell carcinoma: Secondary | ICD-10-CM | POA: Diagnosis not present

## 2023-04-07 DIAGNOSIS — K7030 Alcoholic cirrhosis of liver without ascites: Secondary | ICD-10-CM | POA: Diagnosis not present

## 2023-04-07 NOTE — Consult Note (Signed)
Chief Complaint: Hepatocellular Carcinoma  Referring Physician(s): Feng,Yan  History of Present Illness: Shane Ochoa is a 82 y.o. male with past medical history of arthritis, hypertension, GERD, irritable bowel syndrome, alcohol abuse, and cirrhosis was found to have a 4.4 x 2.9 x 3.7 cm hypoechoic right hepatic lobe mass on ultrasound on 12/27/2022.  Follow-up MRI on 01/26/2023 showed a lesion at the inferior tip of the right hepatic lobe measuring 3.1 cm.  The enhancement pattern of the lesion most consistent with hepatocellular carcinoma (LI-RADS 5).  AFP is mildly elevated at 13 (02/07/2023).    His most recent LFT from 02/07/2023 show mildly elevated AST and ALT and normal Total bilirubin at 0.6.  He presents to interventional radiology clinic today with his 2 daughters for discussion of possible therapy for the hepatocellular carcinoma.  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  He is very active.  He drinks approximally 5 beers per day.   He denies nausea, vomiting, abdominal pain, abdominal distention, melena, bright red blood per rectum, or hematemesis.   Past Medical History:  Diagnosis Date   ALCOHOL ABUSE 03/31/2007   Qualifier: Diagnosis of  By: Maris Berger    ALCOHOLIC HEPATITIS, HX OF 04/06/2007   Qualifier: Diagnosis of  By: Jonny Ruiz MD, Len Blalock    ALLERGIC RHINITIS 04/06/2007   Qualifier: Diagnosis of  By: Jonny Ruiz MD, Len Blalock    ANEMIA-NOS 08/07/2007   Qualifier: Diagnosis of  By: Jonny Ruiz MD, Len Blalock    ANXIETY 03/31/2007   Qualifier: Diagnosis of  By: Maris Berger    Anxiety and depression    ATRIAL FIBRILLATION, PAROXYSMAL, HX OF 03/31/2007   Paroxysmal episode of atrial fibrillation in the remote past     BENIGN PROSTATIC HYPERTROPHY 04/06/2007   Qualifier: Diagnosis of  By: Jonny Ruiz MD, Len Blalock    Cataract    bilateral   Cirrhosis of liver without mention of alcohol 08/02/2007   Qualifier: Diagnosis of  By: Yetta Barre RN, CGRN, Sheri      COLONIC POLYPS, ADENOMATOUS 08/02/2007   Qualifier: Diagnosis of  By: Yetta Barre RN, CGRN, Sheri     COLONIC POLYPS, HX OF 08/07/2007   Qualifier: Diagnosis of  By: Jonny Ruiz MD, Len Blalock    DEPRESSION 03/31/2007   Qualifier: Diagnosis of  By: Maris Berger    DIVERTICULOSIS, COLON 04/06/2007   Qualifier: Diagnosis of  By: Jonny Ruiz MD, Len Blalock    FREQUENCY, URINARY 08/07/2007   Qualifier: Diagnosis of  By: Jonny Ruiz MD, Len Blalock    GERD 08/07/2007   Qualifier: Diagnosis of  By: Jonny Ruiz MD, Len Blalock    HEMORRHOIDS, INTERNAL 08/02/2007   Qualifier: Diagnosis of  By: Yetta Barre RN, CGRN, Sheri     Hypertension    Hypertension    IBS 08/07/2007   Qualifier: Diagnosis of  By: Jonny Ruiz MD, Len Blalock    INGUINAL HERNIA, LEFT 03/31/2007   Qualifier: Diagnosis of  By: Maris Berger    Macular degeneration    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   Qualifier: Diagnosis of  By: Jonny Ruiz MD, Len Blalock    Personal history of urinary calculi 03/31/2007   Centricity Description: RENAL CALCULUS, HX OF Qualifier: Diagnosis of  By: Maris Berger  Centricity Description: NEPHROLITHIASIS, HX OF Qualifier: Diagnosis of  By: Jonny Ruiz MD, Len Blalock    RASH-NONVESICULAR 08/07/2007   Qualifier: Diagnosis of  By: Jonny Ruiz MD, Len Blalock    RECTAL BLEEDING 08/02/2007   Qualifier: Diagnosis of  By: Yetta Barre RN, CGRN, Sheri     THROMBOCYTOPENIA 04/06/2007   Qualifier: Diagnosis of  By: Jonny Ruiz MD, Len Blalock     Past Surgical History:  Procedure Laterality Date   CATARACT EXTRACTION Bilateral    INGUINAL HERNIA REPAIR Right     Allergies: Antihistamines, chlorpheniramine-type  Medications: Prior to Admission medications   Medication Sig Start Date End Date Taking? Authorizing Provider  aspirin 81 MG tablet Take 81 mg by mouth daily.    [provider]  escitalopram (LEXAPRO) 20 MG tablet Take 20 mg by mouth daily.    [provider]  losartan-hydrochlorothiazide (HYZAAR) 100-25 MG per tablet Take 1 tablet by mouth daily.     [provider]  Multiple Vitamins-Minerals (PRESERVISION AREDS 2+MULTI VIT PO) Take by mouth daily. 1 Tablet Daily    [provider]  pantoprazole (PROTONIX) 40 MG tablet Take 40 mg by mouth daily.    [provider]  Tamsulosin HCl (FLOMAX) 0.4 MG CAPS Take 0.4 mg by mouth daily.    [provider]  zolpidem (AMBIEN) 10 MG tablet Take 5 mg by mouth at bedtime as needed. For sleep    [provider]     Family History  Problem Relation Age of Onset   Heart disease Mother    Bladder Cancer Father     Social History   Socioeconomic History   Marital status: Widowed    Spouse name: Not on file   Number of children: 2   Years of education: Not on file   Highest education level: Not on file  Occupational History   Occupation: farmer  Tobacco Use   Smoking status: Never   Smokeless tobacco: Never  Vaping Use   Vaping status: Never Used  Substance and Sexual Activity   Alcohol use: Yes    Comment: since age 33, heavy at times, currently 3-5 beers/day   Drug use: No   Sexual activity: Not on file  Other Topics Concern   Not on file  Social History Narrative   Not on file   Social Determinants of Health   Financial Resource Strain: Not on file  Food Insecurity: No Food Insecurity (03/02/2023)   Hunger Vital Sign    Worried About Running Out of Food in the Last Year: Never true    Ran Out of Food in the Last Year: Never true  Transportation Needs: No Transportation Needs (03/02/2023)   PRAPARE - Administrator, Civil Service (Medical): No    Lack of Transportation (Non-Medical): No  Physical Activity: Not on file  Stress: Not on file  Social Connections: Not on file    ECOG Status: 0 - Asymptomatic  Review of Systems: A 12 point ROS discussed and pertinent positives are indicated in the HPI above.  All other systems are negative.  Review of Systems  Vital Signs: BP (!) 163/71 (BP Location: Left Arm)   Pulse  81   Temp 98.1 F (36.7 C) (Oral)   Resp 18   SpO2 94%   Advance Care Plan: The advanced care plan/surrogate decision maker was discussed at the time of visit and the patient did not wish to discuss or was not able to name a surrogate decision maker or provide an advance care plan.    Physical Exam Constitutional:      Appearance: He is obese.  Cardiovascular:     Rate and Rhythm: Normal rate and regular rhythm.  Pulmonary:     Effort: Pulmonary  effort is normal.     Breath sounds: Normal breath sounds.  Abdominal:     General: There is no distension.     Palpations: Abdomen is soft. There is no mass.     Tenderness: There is no abdominal tenderness. There is no guarding.  Skin:    General: Skin is warm and dry.  Neurological:     Mental Status: He is alert and oriented to person, place, and time.  Psychiatric:        Mood and Affect: Mood normal.        Behavior: Behavior normal.    Imaging: ECHOCARDIOGRAM COMPLETE  Result Date: 03/31/2023    ECHOCARDIOGRAM REPORT   Patient Name:   Shane Ochoa  Date of Exam: 03/31/2023 Medical Rec #:  [id number]     Height:       67.0 in Accession #:    [id number]    Weight:       180.0 lb Date of Birth:  [birth year]     BSA:          1.934 m Patient Age:    81 years      BP:           141/82 mmHg Patient Gender: M             HR:           73 bpm. Exam Location:  [address] Procedure: 3D Echo, 2D Echo, Color Doppler and Cardiac Doppler Indications:    R00.2 Palpitations  History:        Patient has no prior history of Echocardiogram examinations.                 Arrythmias:Atrial Fibrillation; Risk Factors:Hypertension.  Sonographer:    Thurman Coyer RDCS Referring Phys: [id number] BRIAN S CRENSHAW IMPRESSIONS  1. Left ventricular ejection fraction, by estimation, is 50 to 55%. Left ventricular ejection fraction by 3D volume is 50 %. The left ventricle has low normal function. The left ventricle has no regional wall motion abnormalities. Left  ventricular diastolic function could not be evaluated.  2. Right ventricular systolic function is normal. The right ventricular size is normal. There is normal pulmonary artery systolic pressure. The estimated right ventricular systolic pressure is 24.7 mmHg.  3. The mitral valve is abnormal. Trivial mitral valve regurgitation.  4. The aortic valve is tricuspid. Aortic valve regurgitation is trivial. Aortic valve sclerosis/calcification is present, without any evidence of aortic stenosis.  5. The inferior vena cava is normal in size with greater than 50% respiratory variability, suggesting right atrial pressure of 3 mmHg. Comparison(s): No prior Echocardiogram. FINDINGS  Left Ventricle: Left ventricular ejection fraction, by estimation, is 50 to 55%. Left ventricular ejection fraction by 3D volume is 50 %. The left ventricle has low normal function. The left ventricle has no regional wall motion abnormalities. The left ventricular internal cavity size was normal in size. There is no left ventricular hypertrophy. Left ventricular diastolic function could not be evaluated due to atrial fibrillation. Left ventricular diastolic function could not be evaluated. Right Ventricle: The right ventricular size is normal. No increase in right ventricular wall thickness. Right ventricular systolic function is normal. There is normal pulmonary artery systolic pressure. The tricuspid regurgitant velocity is 2.33 m/s, and  with an assumed right atrial pressure of 3 mmHg, the estimated right ventricular systolic pressure is 24.7 mmHg. Left Atrium: Left atrial size was normal in size. Right Atrium: Right atrial size was normal in  size. Pericardium: There is no evidence of pericardial effusion. Mitral Valve: The mitral valve is abnormal. There is mild calcification of the posterior and anterior mitral valve leaflet(s). Trivial mitral valve regurgitation. Tricuspid Valve: The tricuspid valve is grossly normal. Tricuspid valve  regurgitation is trivial. Aortic Valve: The aortic valve is tricuspid. Aortic valve regurgitation is trivial. Aortic valve sclerosis/calcification is present, without any evidence of aortic stenosis. Aortic valve mean gradient measures 6.4 mmHg. Aortic valve peak gradient measures 12.0 mmHg. Aortic valve area, by VTI measures 1.67 cm. Pulmonic Valve: The pulmonic valve was grossly normal. Pulmonic valve regurgitation is trivial. Aorta: The aortic root and ascending aorta are structurally normal, with no evidence of dilitation. Venous: The inferior vena cava is normal in size with greater than 50% respiratory variability, suggesting right atrial pressure of 3 mmHg. IAS/Shunts: No atrial level shunt detected by color flow Doppler.  LEFT VENTRICLE PLAX 2D LVIDd:         5.00 cm         Diastology LVIDs:         3.70 cm         LV e' medial:    4.24 cm/s LV PW:         1.00 cm         LV E/e' medial:  11.4 LV IVS:        0.90 cm         LV e' lateral:   9.03 cm/s LVOT diam:     2.20 cm         LV E/e' lateral: 5.3 LV SV:         60 LV SV Index:   31 LVOT Area:     3.80 cm        3D Volume EF                                LV 3D EF:    Left                                             ventricul                                             ar                                             ejection                                             fraction                                             by 3D  volume is                                             50 %.                                 3D Volume EF:                                3D EF:        50 %                                LV EDV:       125 ml                                LV ESV:       62 ml                                LV SV:        63 ml RIGHT VENTRICLE             IVC RV Basal diam:  4.60 cm     IVC diam: 1.60 cm RV Mid diam:    3.80 cm RV S prime:     15.00 cm/s TAPSE (M-mode): 2.2 cm LEFT ATRIUM             Index         RIGHT ATRIUM           Index LA diam:        4.10 cm 2.12 cm/m   RA Area:     16.30 cm LA Vol (A2C):   50.1 ml 25.91 ml/m  RA Volume:   42.20 ml  21.82 ml/m LA Vol (A4C):   55.3 ml 28.60 ml/m LA Biplane Vol: 52.9 ml 27.36 ml/m  AORTIC VALVE AV Area (Vmax):    1.67 cm AV Area (Vmean):   1.68 cm AV Area (VTI):     1.67 cm AV Vmax:           173.40 cm/s AV Vmean:          115.500 cm/s AV VTI:            0.361 m AV Peak Grad:      12.0 mmHg AV Mean Grad:      6.4 mmHg LVOT Vmax:         76.25 cm/s LVOT Vmean:        51.100 cm/s LVOT VTI:          0.159 m LVOT/AV VTI ratio: 0.44  AORTA Ao Root diam: 3.30 cm Ao Asc diam:  3.50 cm MITRAL VALVE               TRICUSPID VALVE MV Area (PHT): 2.20 cm    TR Peak grad:   21.7 mmHg MV Decel Time: 345 msec    TR Vmax:        233.00 cm/s MV E velocity: 48.20 cm/s MV A velocity: 77.00 cm/s  SHUNTS MV  E/A ratio:  0.63        Systemic VTI:  0.16 m                            Systemic Diam: 2.20 cm Zoila Shutter MD Electronically signed by Zoila Shutter MD Signature Date/Time: 03/31/2023/12:05:24 PM    Final    CT Chest Wo Contrast  Result Date: 03/22/2023 CLINICAL DATA:  Hepatocellular carcinoma staging. * Tracking Code: BO * EXAM: CT CHEST WITHOUT CONTRAST TECHNIQUE: Multidetector CT imaging of the chest was performed following the standard protocol without IV contrast. RADIATION DOSE REDUCTION: This exam was performed according to the departmental dose-optimization program which includes automated exposure control, adjustment of the mA and/or kV according to patient size and/or use of iterative reconstruction technique. COMPARISON:  MR abdomen 01/26/2023, CT abdomen pelvis 09/30/2022. FINDINGS: Cardiovascular: Atherosclerotic calcification of the aorta, aortic valve and left anterior descending coronary artery. Heart is enlarged. No pericardial effusion. Mediastinum/Nodes: No pathologically enlarged mediastinal or axillary lymph nodes. Hilar regions are difficult to  definitively evaluate without IV contrast. Esophagus is grossly unremarkable. Lungs/Pleura: Image quality is degraded by expiratory phase imaging and respiratory motion, creating added density in the lungs. No suspicious pulmonary nodules. No pleural fluid. Airway is unremarkable. Upper Abdomen: Visualized portion of the liver is decreased in attenuation diffusely and irregular. Visualized portions of the gallbladder, adrenal glands and right kidney are otherwise unremarkable. There may be a Creps stone in the left kidney. Visualized portions the spleen, pancreas, stomach and bowel are grossly unremarkable. No upper abdominal adenopathy. Musculoskeletal: Degenerative changes in the spine. IMPRESSION: 1. No evidence of metastatic disease. 2. Cirrhosis. Associated liver mass, better seen on MR abdomen 01/26/2023. 3. Possible Vancleve stone in the left kidney. 4. Aortic atherosclerosis (ICD10-I70.0). Coronary artery calcification. Electronically Signed   By: Leanna Battles M.D.   On: 03/22/2023 09:58    Labs:  CBC: Recent Labs    06/19/22 1159 09/30/22 1141 02/07/23 0950  WBC 2.5* 3.3* 3.0*  HGB 13.9 16.4 14.3  HCT 40.3 47.9 43.2  PLT 69* 108* 106.0*    COAGS: Recent Labs    02/07/23 0950  INR 1.1*    BMP: Recent Labs    06/19/22 1159 09/30/22 1141 02/07/23 0950  NA 139 137 140  K 4.0 3.2* 4.2  CL 102 107 103  CO2 28 21* 29  GLUCOSE 111* 120* 87  BUN 9 13 13   CALCIUM 8.7* 8.6* 9.1  CREATININE 0.96 0.91 0.91  GFRNONAA >60 >60  --     LIVER FUNCTION TESTS: Recent Labs    09/30/22 1141 02/07/23 0950  BILITOT 0.6 0.6  AST 116* 54*  ALT 93* 54*  ALKPHOS 63 74  PROT 7.2 6.5  ALBUMIN 4.3 3.9    TUMOR MARKERS: Recent Labs    02/07/23 0950  AFPTM 13.0*    Assessment and Plan:  82 year old gentleman with history of alcoholic cirrhosis and newly discovered LI-RADS 5 right liver mass consistent with hepatocellular carcinoma.  He presents with his 2 daughters to IR clinic  today for discussion for possible therapy options for this lesion.  I discussed with them possible options of microwave ablation, intra-arterial chemoembolization, and intra-arterial radioembolization.    I explained that he would not be a good candidate for ablation given the subcapsular location of the lesion as this would cause significant pain.  I explained that radioembolization would likely be better tolerated than chemoembolization, and that this  lesion was a good candidate for radiation segmentectomy.    I explained to them in detail the steps of the procedures including the mapping session as well as the treatment.  We discussed the expected recovery, when it is comment experience fatigue, nausea, vomiting, anorexia, diarrhea, and abdominal pain.   We discussed the risks and benefits including, but not limited to bleeding, infection, vascular injury, post procedural pain, nausea, vomiting and fatigue, contrast induced renal failure, liver failure, radiation injury to the bowel, radiation induced cholecystitis, neutropenia and possible need for additional procedures.  I explained that he is at higher risk for liver injury given that he is currently drinking approximately 5 beers per day.  He and his daughters are concerned about the side effects, recovery period, and decreased quality of life.  Given the current size of the lesion and the usual rate of growth of HCC, they would like to repeat the MRI in a few months rather than move forward with radioembolization at this time.  I believe this is a reasonable choice for Mr. Deborde.  I explained to them that he should strongly consider stopping or cutting back his alcohol intake as this could worsen his cirrhosis.  I discussed with them the complications which can develop with cirrhosis and portal hypertension which would significantly affect his quality of life.  Plan: Repeat Abdominal MRI in 06/2023, followed by clinic appointment.  Thank you  for this interesting consult.  I greatly enjoyed meeting Shane Ochoa and look forward to participating in their care.  A copy of this report was sent to the requesting provider on this date.  Electronically Signed: Al Corpus Jaylynne Birkhead 04/07/2023, 4:11 PM   I spent a total of  60 Minutes in face to face in clinical consultation, greater than 50% of which was counseling/coordinating care for Hepatocellular carcinoma.

## 2023-05-03 DIAGNOSIS — H43811 Vitreous degeneration, right eye: Secondary | ICD-10-CM | POA: Diagnosis not present

## 2023-05-03 DIAGNOSIS — H35371 Puckering of macula, right eye: Secondary | ICD-10-CM | POA: Diagnosis not present

## 2023-05-03 DIAGNOSIS — H353134 Nonexudative age-related macular degeneration, bilateral, advanced atrophic with subfoveal involvement: Secondary | ICD-10-CM | POA: Diagnosis not present

## 2023-05-03 DIAGNOSIS — H04123 Dry eye syndrome of bilateral lacrimal glands: Secondary | ICD-10-CM | POA: Diagnosis not present

## 2023-05-18 ENCOUNTER — Emergency Department (HOSPITAL_BASED_OUTPATIENT_CLINIC_OR_DEPARTMENT_OTHER): Payer: Medicare Other | Admitting: Radiology

## 2023-05-18 ENCOUNTER — Other Ambulatory Visit: Payer: Self-pay

## 2023-05-18 ENCOUNTER — Emergency Department (HOSPITAL_BASED_OUTPATIENT_CLINIC_OR_DEPARTMENT_OTHER)
Admission: EM | Admit: 2023-05-18 | Discharge: 2023-05-18 | Disposition: A | Discharge status: Home / Self Care | Payer: Medicare Other | Attending: Emergency Medicine | Admitting: Emergency Medicine

## 2023-05-18 ENCOUNTER — Encounter (HOSPITAL_BASED_OUTPATIENT_CLINIC_OR_DEPARTMENT_OTHER): Payer: Self-pay

## 2023-05-18 DIAGNOSIS — M79641 Pain in right hand: Secondary | ICD-10-CM | POA: Diagnosis not present

## 2023-05-18 DIAGNOSIS — Z79899 Other long term (current) drug therapy: Secondary | ICD-10-CM | POA: Insufficient documentation

## 2023-05-18 DIAGNOSIS — Z7982 Long term (current) use of aspirin: Secondary | ICD-10-CM | POA: Insufficient documentation

## 2023-05-18 DIAGNOSIS — I10 Essential (primary) hypertension: Secondary | ICD-10-CM | POA: Diagnosis not present

## 2023-05-18 DIAGNOSIS — L03113 Cellulitis of right upper limb: Secondary | ICD-10-CM | POA: Diagnosis not present

## 2023-05-18 DIAGNOSIS — M19041 Primary osteoarthritis, right hand: Secondary | ICD-10-CM | POA: Diagnosis not present

## 2023-05-18 DIAGNOSIS — M7989 Other specified soft tissue disorders: Secondary | ICD-10-CM | POA: Diagnosis not present

## 2023-05-18 MED ORDER — CEPHALEXIN 500 MG PO CAPS: 500.0000 mg | ORAL_CAPSULE | Freq: Four times a day (QID) | ORAL | 0 refills | Status: AC

## 2023-05-18 NOTE — ED Triage Notes (Signed)
Pt c/o R hand pain after pulling pin on trailer. States that he "yanked the chain, & then it came back & hit me." Incident happened yesterday evening  Limited ROM to R hand, swelling to dorsal aspect of hand.

## 2023-05-18 NOTE — ED Provider Notes (Signed)
[place] EMERGENCY DEPARTMENT AT [facility] Provider Note   CSN: [id number] Arrival date & time: 05/18/23  1119     History  No chief complaint on file.   Shane Ochoa is a 82 y.o. male with past medical history significant for osteoarthritis, atrial fibrillation, hypertension presents to the ED complaining of right hand pain and swelling.  He states that yesterday he was pulling a pen on a trailer and after he yanked the chain the pain came back and hit him on the back of the hand.  He states when he woke up this morning his hand was very swollen and he could barely move it due to the pain.  He also reports that there was some drainage from where the swelling is.  He states that his range of motion has improved, but it is still sore.       Home Medications Prior to Admission medications   Medication Sig Start Date End Date Taking? Authorizing Provider  cephALEXin (KEFLEX) 500 MG capsule Take 1 capsule (500 mg total) by mouth 4 (four) times daily. 05/18/23  Yes Arla Boutwell R, PA-C  aspirin 81 MG tablet Take 81 mg by mouth daily.    [provider]  escitalopram (LEXAPRO) 20 MG tablet Take 20 mg by mouth daily.    [provider]  losartan-hydrochlorothiazide (HYZAAR) 100-25 MG per tablet Take 1 tablet by mouth daily.    [provider]  Multiple Vitamins-Minerals (PRESERVISION AREDS 2+MULTI VIT PO) Take by mouth daily. 1 Tablet Daily    [provider]  pantoprazole (PROTONIX) 40 MG tablet Take 40 mg by mouth daily.    [provider]  Tamsulosin HCl (FLOMAX) 0.4 MG CAPS Take 0.4 mg by mouth daily.    [provider]  zolpidem (AMBIEN) 10 MG tablet Take 5 mg by mouth at bedtime as needed. For sleep    [provider]      Allergies    Antihistamines, chlorpheniramine-type    Review of Systems   Review of Systems  Musculoskeletal:  Positive for joint swelling.  Skin:  Positive for wound.    Physical  Exam Updated Vital Signs BP (!) 161/80   Pulse 67   Temp 98.6 F (37 C)   Resp 16   SpO2 93%  Physical Exam Vitals and nursing note reviewed.  Constitutional:      General: He is not in acute distress.    Appearance: Normal appearance. He is not ill-appearing or diaphoretic.  Cardiovascular:     Rate and Rhythm: Normal rate and regular rhythm.  Pulmonary:     Effort: Pulmonary effort is normal.  Musculoskeletal:     Right hand: Swelling and tenderness present. No deformity, lacerations or bony tenderness. Normal range of motion. Normal capillary refill. Normal pulse.     Comments: Dorsum of the right hand with erythema, increased warmth, and swelling across the MCPs.  There is a pinpoint wound centrally to this area of swelling.  Skin:    General: Skin is warm and dry.     Capillary Refill: Capillary refill takes less than 2 seconds.  Neurological:     Mental Status: He is alert. Mental status is at baseline.  Psychiatric:        Mood and Affect: Mood normal.        Behavior: Behavior normal.     ED Results / Procedures / Treatments   Labs (all labs ordered are listed, but only abnormal results are  displayed) Labs Reviewed - No data to display  EKG None  Radiology No results found.  Procedures Procedures    Medications Ordered in ED Medications - No data to display  ED Course/ Medical Decision Making/ A&P                                 Medical Decision Making Amount and/or Complexity of Data Reviewed Radiology: ordered.  Risk Prescription drug management.   This patient presents to the ED with chief complaint(s) of right hand pain and injury with pertinent past medical history of osteoarthritis.  The complaint involves an extensive differential diagnosis and also carries with it a high risk of complications and morbidity.    The differential diagnosis includes acute fracture, cellulitis, contusion   Initial Assessment:   Exam significant for  erythema, increased warmth, and swelling across the MCPs of the dorsum of the right hand.  There is also a pinpoint wound centrally within the area of swelling.  There is mild tenderness.  There is no obvious bony deformity or tenderness.  No decreased ROM.  Radial pulses 2+.  Hand is neurovascularly intact.    Independent interpretation of imaging: Right hand x-ray was obtained and images are personally interpreted by me.  No obvious acute fracture or dislocation.  Soft tissue swelling is present.  There is also evidence of osteoarthritis.  Disposition:   Based on physical exam and patient stating he did have some drainage from the wound earlier, there is concern for cellulitis.  Will treat with 5 days of cephalexin.  Discussed supportive care measures for injury and swelling with patient.  Recommended orthopedics follow-up.  Patient is being discharged prior to official radiology read on x-ray.  He states that he is very hungry and does not wish to wait any longer.  Patient will be informed if there is acute abnormality that would require his return to the ED for further treatment.  The patient has been appropriately medically screened and/or stabilized in the ED. I have low suspicion for any other emergent medical condition which would require further screening, evaluation or treatment in the ED or require inpatient management. At time of discharge the patient is hemodynamically stable and in no acute distress. I have discussed work-up results and diagnosis with patient and answered all questions. Patient is agreeable with discharge plan. We discussed strict return precautions for returning to the emergency department and they verbalized understanding.    3:13 PM Final result for hand x-ray negative for acute bony abnormality.        Final Clinical Impression(s) / ED Diagnoses Final diagnoses:  Right hand pain  Cellulitis of right upper extremity    Rx / DC Orders ED Discharge Orders           Ordered    cephALEXin (KEFLEX) 500 MG capsule  4 times daily        05/18/23 1412              Lenard Simmer, PA-C 05/18/23 1514    Royanne Foots, DO 05/25/23 0012

## 2023-05-18 NOTE — Discharge Instructions (Signed)
Thank you for allowing [place] to be a part of your care today.  Your x-ray has not officially been read, but I do not see any evidence of broken bones.  If it comes back with an abnormal finding, I will call your daughter.   Your hand is concerning for a soft tissue infection.  I have sent in an antibiotic to treat this.  Please take as prescribed.   I recommend using ice 2-3 times per day for 20 minutes at a time for the next 24-hours.  Transition to warm compresses after this time.   Follow up with orthopedics if your symptoms do not begin to improve with time and treatment.  You may go to [facility].  They also have an urgent care should you need immediate care.     Return to the ED if you develop sudden worsening of your symptoms or if you have new concerns.

## 2023-07-04 DIAGNOSIS — H353134 Nonexudative age-related macular degeneration, bilateral, advanced atrophic with subfoveal involvement: Secondary | ICD-10-CM | POA: Diagnosis not present

## 2023-07-04 DIAGNOSIS — H35371 Puckering of macula, right eye: Secondary | ICD-10-CM | POA: Diagnosis not present

## 2023-07-04 DIAGNOSIS — H04123 Dry eye syndrome of bilateral lacrimal glands: Secondary | ICD-10-CM | POA: Diagnosis not present

## 2023-07-04 DIAGNOSIS — H43811 Vitreous degeneration, right eye: Secondary | ICD-10-CM | POA: Diagnosis not present

## 2023-09-20 DIAGNOSIS — H353134 Nonexudative age-related macular degeneration, bilateral, advanced atrophic with subfoveal involvement: Secondary | ICD-10-CM | POA: Diagnosis not present

## 2023-09-20 DIAGNOSIS — H43813 Vitreous degeneration, bilateral: Secondary | ICD-10-CM | POA: Diagnosis not present

## 2023-09-20 DIAGNOSIS — H04123 Dry eye syndrome of bilateral lacrimal glands: Secondary | ICD-10-CM | POA: Diagnosis not present

## 2023-09-20 DIAGNOSIS — H35371 Puckering of macula, right eye: Secondary | ICD-10-CM | POA: Diagnosis not present

## 2023-11-01 DIAGNOSIS — M545 Low back pain, unspecified: Secondary | ICD-10-CM | POA: Diagnosis not present

## 2023-11-01 DIAGNOSIS — M5416 Radiculopathy, lumbar region: Secondary | ICD-10-CM | POA: Diagnosis not present

## 2023-11-14 DIAGNOSIS — M5416 Radiculopathy, lumbar region: Secondary | ICD-10-CM | POA: Diagnosis not present

## 2023-11-15 DIAGNOSIS — H04123 Dry eye syndrome of bilateral lacrimal glands: Secondary | ICD-10-CM | POA: Diagnosis not present

## 2023-11-15 DIAGNOSIS — H35371 Puckering of macula, right eye: Secondary | ICD-10-CM | POA: Diagnosis not present

## 2023-11-15 DIAGNOSIS — H353134 Nonexudative age-related macular degeneration, bilateral, advanced atrophic with subfoveal involvement: Secondary | ICD-10-CM | POA: Diagnosis not present

## 2023-11-15 DIAGNOSIS — H43813 Vitreous degeneration, bilateral: Secondary | ICD-10-CM | POA: Diagnosis not present

## 2023-11-29 DIAGNOSIS — K7460 Unspecified cirrhosis of liver: Secondary | ICD-10-CM | POA: Diagnosis not present

## 2023-11-29 DIAGNOSIS — I10 Essential (primary) hypertension: Secondary | ICD-10-CM | POA: Diagnosis not present

## 2023-11-29 DIAGNOSIS — D696 Thrombocytopenia, unspecified: Secondary | ICD-10-CM | POA: Diagnosis not present

## 2023-11-29 DIAGNOSIS — F329 Major depressive disorder, single episode, unspecified: Secondary | ICD-10-CM | POA: Diagnosis not present

## 2024-01-16 DIAGNOSIS — H35371 Puckering of macula, right eye: Secondary | ICD-10-CM | POA: Diagnosis not present

## 2024-01-16 DIAGNOSIS — H04123 Dry eye syndrome of bilateral lacrimal glands: Secondary | ICD-10-CM | POA: Diagnosis not present

## 2024-01-16 DIAGNOSIS — H43813 Vitreous degeneration, bilateral: Secondary | ICD-10-CM | POA: Diagnosis not present

## 2024-01-16 DIAGNOSIS — H353134 Nonexudative age-related macular degeneration, bilateral, advanced atrophic with subfoveal involvement: Secondary | ICD-10-CM | POA: Diagnosis not present

## 2024-02-15 DIAGNOSIS — M5416 Radiculopathy, lumbar region: Secondary | ICD-10-CM | POA: Diagnosis not present

## 2024-03-08 ENCOUNTER — Other Ambulatory Visit (HOSPITAL_COMMUNITY): Payer: Self-pay | Admitting: Internal Medicine

## 2024-03-08 DIAGNOSIS — C220 Liver cell carcinoma: Secondary | ICD-10-CM

## 2024-03-14 DIAGNOSIS — H43813 Vitreous degeneration, bilateral: Secondary | ICD-10-CM | POA: Diagnosis not present

## 2024-03-14 DIAGNOSIS — H35371 Puckering of macula, right eye: Secondary | ICD-10-CM | POA: Diagnosis not present

## 2024-03-14 DIAGNOSIS — H353124 Nonexudative age-related macular degeneration, left eye, advanced atrophic with subfoveal involvement: Secondary | ICD-10-CM | POA: Diagnosis not present

## 2024-03-14 DIAGNOSIS — H04123 Dry eye syndrome of bilateral lacrimal glands: Secondary | ICD-10-CM | POA: Diagnosis not present

## 2024-03-14 DIAGNOSIS — H353113 Nonexudative age-related macular degeneration, right eye, advanced atrophic without subfoveal involvement: Secondary | ICD-10-CM | POA: Diagnosis not present

## 2024-03-15 ENCOUNTER — Other Ambulatory Visit (HOSPITAL_COMMUNITY)

## 2024-03-20 ENCOUNTER — Ambulatory Visit (HOSPITAL_COMMUNITY)
Admission: RE | Admit: 2024-03-20 | Discharge: 2024-03-20 | Disposition: A | Discharge status: Home / Self Care | Source: Ambulatory Visit | Attending: Internal Medicine | Admitting: Internal Medicine

## 2024-03-20 DIAGNOSIS — C220 Liver cell carcinoma: Secondary | ICD-10-CM | POA: Diagnosis not present

## 2024-03-20 DIAGNOSIS — R161 Splenomegaly, not elsewhere classified: Secondary | ICD-10-CM | POA: Diagnosis not present

## 2024-03-20 DIAGNOSIS — K769 Liver disease, unspecified: Secondary | ICD-10-CM | POA: Diagnosis not present

## 2024-03-20 DIAGNOSIS — R188 Other ascites: Secondary | ICD-10-CM | POA: Diagnosis not present

## 2024-03-20 MED ORDER — GADOBUTROL 1 MMOL/ML IV SOLN
7.0000 mL | Freq: Once | INTRAVENOUS | Status: AC | PRN
  Administered 2024-03-20: 7 mL via INTRAVENOUS

## 2024-03-31 ENCOUNTER — Emergency Department (HOSPITAL_COMMUNITY)

## 2024-03-31 ENCOUNTER — Emergency Department (HOSPITAL_COMMUNITY)
Admission: EM | Admit: 2024-03-31 | Discharge: 2024-03-31 | Disposition: A | Discharge status: Home / Self Care | Source: Skilled Nursing Facility | Attending: Emergency Medicine | Admitting: Emergency Medicine

## 2024-03-31 DIAGNOSIS — S0511XA Contusion of eyeball and orbital tissues, right eye, initial encounter: Secondary | ICD-10-CM | POA: Diagnosis not present

## 2024-03-31 DIAGNOSIS — I6523 Occlusion and stenosis of bilateral carotid arteries: Secondary | ICD-10-CM | POA: Diagnosis not present

## 2024-03-31 DIAGNOSIS — H05231 Hemorrhage of right orbit: Secondary | ICD-10-CM | POA: Diagnosis not present

## 2024-03-31 DIAGNOSIS — Z23 Encounter for immunization: Secondary | ICD-10-CM | POA: Diagnosis not present

## 2024-03-31 DIAGNOSIS — W19XXXA Unspecified fall, initial encounter: Secondary | ICD-10-CM | POA: Diagnosis not present

## 2024-03-31 DIAGNOSIS — S0083XA Contusion of other part of head, initial encounter: Secondary | ICD-10-CM | POA: Diagnosis not present

## 2024-03-31 DIAGNOSIS — W172XXA Fall into hole, initial encounter: Secondary | ICD-10-CM | POA: Insufficient documentation

## 2024-03-31 DIAGNOSIS — I10 Essential (primary) hypertension: Secondary | ICD-10-CM | POA: Insufficient documentation

## 2024-03-31 DIAGNOSIS — S0990XA Unspecified injury of head, initial encounter: Secondary | ICD-10-CM

## 2024-03-31 DIAGNOSIS — R220 Localized swelling, mass and lump, head: Secondary | ICD-10-CM | POA: Diagnosis not present

## 2024-03-31 DIAGNOSIS — Z7982 Long term (current) use of aspirin: Secondary | ICD-10-CM | POA: Insufficient documentation

## 2024-03-31 DIAGNOSIS — J320 Chronic maxillary sinusitis: Secondary | ICD-10-CM | POA: Diagnosis not present

## 2024-03-31 DIAGNOSIS — R609 Edema, unspecified: Secondary | ICD-10-CM | POA: Diagnosis not present

## 2024-03-31 DIAGNOSIS — S0510XA Contusion of eyeball and orbital tissues, unspecified eye, initial encounter: Secondary | ICD-10-CM | POA: Diagnosis not present

## 2024-03-31 DIAGNOSIS — R404 Transient alteration of awareness: Secondary | ICD-10-CM | POA: Diagnosis not present

## 2024-03-31 DIAGNOSIS — S50311A Abrasion of right elbow, initial encounter: Secondary | ICD-10-CM | POA: Diagnosis not present

## 2024-03-31 DIAGNOSIS — J321 Chronic frontal sinusitis: Secondary | ICD-10-CM | POA: Diagnosis not present

## 2024-03-31 MED ORDER — HYDROCHLOROTHIAZIDE 25 MG PO TABS
25.0000 mg | ORAL_TABLET | Freq: Every day | ORAL | Status: DC
  Administered 2024-03-31: 25 mg via ORAL
  Filled 2024-03-31: qty 1

## 2024-03-31 MED ORDER — TETANUS-DIPHTH-ACELL PERTUSSIS 5-2.5-18.5 LF-MCG/0.5 IM SUSY
0.5000 mL | PREFILLED_SYRINGE | Freq: Once | INTRAMUSCULAR | Status: AC
  Administered 2024-03-31: 0.5 mL via INTRAMUSCULAR
  Filled 2024-03-31: qty 0.5

## 2024-03-31 MED ORDER — LOSARTAN POTASSIUM 25 MG PO TABS
100.0000 mg | ORAL_TABLET | Freq: Once | ORAL | Status: AC
  Administered 2024-03-31: 100 mg via ORAL
  Filled 2024-03-31: qty 4

## 2024-03-31 NOTE — ED Triage Notes (Signed)
 Patient arrived from home with [facility], patient had a fall Friday night 9/12 while working in his [place], bruising and swelling noted to R side of head/eye. Skin tear noted to R elbow. Daughter saw patient today and noticed the bruising and called 911. Daughter reports patient drinks alcohol and has slight dementia. Patient reports no pain.

## 2024-03-31 NOTE — Discharge Instructions (Signed)
 Your CT imaging today is negative for any deep injuries including no brain injury or fractures of your skull or face.  I do recommend continued ice packs as much as is comfortable which can help with pain and swelling.  You may also use Tylenol  or Motrin as needed for pain relief.  Plan to be rechecked by your primary doctor within the next week to 10 days if your symptoms are not significantly improving.

## 2024-03-31 NOTE — ED Provider Notes (Signed)
 [place] EMERGENCY DEPARTMENT AT [facility] Provider Note   CSN: [id number] Arrival date & time: 03/31/24  1237     Patient presents with: Fall   Shane Ochoa is a 83 y.o. male with a history including hypertension, liver cirrhosis, history of EtOH abuse, atrial fibrillation who is not anticoagulated presenting for evaluation of injury sustained in a fall which occurred Friday evening.  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  He has significant swelling and bruising of his right forehead and upper face, along with a Kunde skin tear to his right elbow.  Patient denies any significant pain at the sites of his injuries.  He denies LOC for the event, denies dizziness, weakness, changes in vision, denies any pain with blinking or EOMs.  No eye foreign body sensation.  He denies any extremity pain, neck or back pain from this injury.  He reports some chronic left knee pain which is not worse since his fall.  He has had no treatment prior to arrival.   The history is provided by the patient.       Prior to Admission medications   Medication Sig Start Date End Date Taking? Authorizing Provider  aspirin 81 MG tablet Take 81 mg by mouth daily.    [provider]  cephALEXin  (KEFLEX ) 500 MG capsule Take 1 capsule (500 mg total) by mouth 4 (four) times daily. 05/18/23   Clark, Meghan R, PA-C  escitalopram (LEXAPRO) 20 MG tablet Take 20 mg by mouth daily.    [provider]  losartan -hydrochlorothiazide  (HYZAAR) 100-25 MG per tablet Take 1 tablet by mouth daily.    [provider]  Multiple Vitamins-Minerals (PRESERVISION AREDS 2+MULTI VIT PO) Take by mouth daily. 1 Tablet Daily    [provider]  pantoprazole (PROTONIX) 40 MG tablet Take 40 mg by mouth daily.    [provider]  Tamsulosin HCl (FLOMAX) 0.4 MG  CAPS Take 0.4 mg by mouth daily.    [provider]  zolpidem (AMBIEN) 10 MG tablet Take 5 mg by mouth at bedtime as needed. For sleep    [provider]    Allergies: Antihistamines, chlorpheniramine-type    Review of Systems  Constitutional:  Negative for fever.  HENT:  Positive for facial swelling. Negative for congestion and sore throat.   Eyes:  Positive for redness. Negative for pain.  Respiratory:  Negative for chest tightness and shortness of breath.   Cardiovascular:  Negative for chest pain.  Gastrointestinal:  Negative for abdominal pain, nausea and vomiting.  Genitourinary: Negative.   Musculoskeletal:  Negative for arthralgias, joint swelling and neck pain.  Skin:  Positive for color change and wound. Negative for rash.  Neurological:  Negative for dizziness, weakness, light-headedness, numbness and headaches.  Psychiatric/Behavioral: Negative.      Updated Vital Signs BP (!) 186/84   Pulse 69   Temp 98.1 F (36.7 C) (Oral)   Resp 13   Ht 5' 7 (1.702 m)   Wt 74.8 kg   SpO2 95%   BMI 25.84 kg/m   Physical Exam Vitals and nursing note reviewed.  Constitutional:      Appearance: He is well-developed.  HENT:     Head: Normocephalic.     Comments: Significant bruising and edema to patient's right forehead and temple region along with periorbital edema and bruising.  Mildly tender, skin is intact.    Nose: Nose normal.  Eyes:     Conjunctiva/sclera:     Right eye: Right conjunctiva is injected. No chemosis or exudate.    Comments: Right lateral eye revealing for a inferior scleral hemorrhage.  Cardiovascular:     Rate and Rhythm: Normal rate and regular rhythm.     Heart sounds: Normal heart sounds.  Pulmonary:     Effort: Pulmonary effort is normal.     Breath sounds: Normal breath sounds. No wheezing.  Abdominal:     General: Bowel sounds are normal.     Palpations: Abdomen is soft.     Tenderness: There is no abdominal tenderness.   Musculoskeletal:        General: No swelling, tenderness or deformity. Normal range of motion.     Cervical back: Full passive range of motion without pain and normal range of motion.  Skin:    General: Skin is warm and dry.     Comments: Superficial abrasion right lateral elbow.  Neurological:     General: No focal deficit present.     Mental Status: He is alert and oriented to person, place, and time.     (all labs ordered are listed, but only abnormal results are displayed) Labs Reviewed - No data to display  EKG: EKG Interpretation Date/Time:  Sunday March 31 2024 13:16:59 EDT Ventricular Rate:  59 PR Interval:  187 QRS Duration:  100 QT Interval:  432 QTC Calculation: 428 R Axis:   -39  Text Interpretation: Sinus rhythm Ventricular bigeminy Inferior infarct, old Anteroseptal infarct, age indeterminate No significant change since last tracing Confirmed by Towana Sharper [id number]) on 03/31/2024 2:45:29 PM  Radiology: CT Maxillofacial Wo Contrast Result Date: 03/31/2024 EXAM: CT OF THE FACE WITHOUT CONTRAST 03/31/2024 03:48:36 PM TECHNIQUE: CT of the face was performed without the administration of intravenous contrast. Multiplanar reformatted images are provided for review. Automated exposure control, iterative reconstruction, and/or weight based adjustment of the mA/kV was utilized to reduce the radiation dose to as low as reasonably achievable. COMPARISON: None available. CLINICAL HISTORY: Facial trauma, blunt. 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 Skin tear noted to the right elbow. Daughter saw patient today and noticed the bruising and called 911. Daughter reports patient drinks alcohol and has slight dementia. Patient reports no pain. FINDINGS: FACIAL BONES: No acute facial fracture. No mandibular dislocation. No suspicious bone lesion. ORBITS: Right supraorbital hematoma is present.  Hemorrhage and soft tissue swelling extend about the periorbital area and over the right side of the face. No underlying fracture or foreign body is present. SINUSES AND MASTOIDS: The right maxillary sinus is partially opacified. Chronic right maxillary sinus disease is noted. Partial ethmoidectomies are present. The residual anterior ethmoid air cells in the frontal sinuses are opacified. SOFT TISSUES: Atherosclerotic calcifications are present in the cavernous carotid arteries bilaterally. No hyperdense vessel is present. SPINE: Moderate degenerative changes are present in the visualized upper cervical spine. 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 IMPRESSION: 1. No acute facial fracture. 2. Right supraorbital hematoma with associated periorbital and right facial soft tissue swelling. No underlying fracture or foreign body. 3. 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 Electronically signed by: Lonni Necessary MD 03/31/2024 04:53 PM EDT RP Workstation: [id number]   CT Head Wo Contrast Result Date: 03/31/2024 EXAM: CT HEAD WITHOUT CONTRAST 03/31/2024 03:48:36 PM TECHNIQUE: CT  of the head was performed without the administration of intravenous contrast. Automated exposure control, iterative reconstruction, and/or weight based adjustment of the mA/kV was utilized to reduce the radiation dose to as low as reasonably achievable. COMPARISON: CT head without contrast 08/01/2005. CLINICAL HISTORY: Head trauma, minor (Age >= 65y). Trauma; arrived from home with [facility], patient had a fall Friday night 9/12 while working in his cow [place], bruising and swelling noted to R side of head/eye. Skin tear noted to R elbow. Daughter saw patient today and noticed the bruising and called 911. Daughter reports patient drinks alcohol and has slight dementia. Patient reports no pain. FINDINGS: BRAIN AND VENTRICLES: No acute hemorrhage. No evidence of acute infarct. No hydrocephalus. No  extra-axial collection. No mass effect or midline shift. Progressive atrophy and white matter disease is noted. ORBITS: Right supraorbital scalp hematomas present without underlying fracture. Soft tissue swelling extends about the periorbital area and right cheek. SINUSES: Chronic opacification of the right maxillary sinus is present. Bilateral maxillary antrostomies and partial ethmoidectomies are noted. The residual anterior ethmoid air cells and frontal sinuses are opacified. SOFT TISSUES AND SKULL: No skull fracture. Atherosclerotic calcifications are present in the cavernous carotid arteries bilaterally. No hyperdense vessel is present. IMPRESSION: 1. Right supraorbital scalp hematoma without underlying fracture or foreign body, with associated soft tissue swelling extending to the periorbital area and right cheek. 2. Progressive atrophy and white matter disease. Electronically signed by: Lonni Necessary MD 03/31/2024 04:46 PM EDT RP Workstation: [id number]     Procedures   Medications Ordered in the ED  Tdap (BOOSTRIX ) injection 0.5 mL (0.5 mLs Intramuscular Given 03/31/24 1700)  losartan  (COZAAR ) tablet 100 mg (100 mg Oral Given 03/31/24 1727)                                    Medical Decision Making Patient presenting for evaluation of head and facial injury from a fall which occurred 2 days ago, he was walking in his cow [place] and tripped in a hole landing face forward onto the ground hitting his right forehead and face.  He denies LOC at the time of the event and has had no problems with weakness, dizziness, nausea vomiting, he denies headache and Early denies face pain as well although he states it does feel full.  He denies any eye pain, he can blink his eyes and with EOMs there is no eye tenderness or pain, no foreign body sensation.  He does have a Stamour subconjunctival hemorrhage in his right eye his right eye but his visual acuity is completely normal.  Imaging as outlined below  and reassuring.  Discussed home treatment plans ice/heat along with reevaluation for any new symptoms, given imaging is negative for intracranial problems and the injury occurred occurred 2 days ago, reassuring at at this time that patient would have any new or worsening symptoms  Patient's 2 daughters are here with him today and they keep a close watch on their father.  Amount and/or Complexity of Data Reviewed Radiology: ordered.    Details: CT head and CT maxillofacial reviewed reviewed and negative for acute fracture or intracranial injury.          Final diagnoses:  Injury of head, initial encounter  Periorbital hematoma of right eye    ED Discharge Orders     None          Birdena Clarity, PA-C 04/02/24 [id number]  Towana Ozell BROCKS, MD 04/04/24 [phone]

## 2024-04-10 ENCOUNTER — Other Ambulatory Visit: Payer: Self-pay

## 2024-04-10 ENCOUNTER — Telehealth: Payer: Self-pay | Admitting: Hematology

## 2024-04-10 NOTE — Telephone Encounter (Signed)
 Called the pt and LVM regarding  upcoming appts.

## 2024-04-15 ENCOUNTER — Encounter: Payer: Self-pay | Admitting: Nurse Practitioner

## 2024-04-16 ENCOUNTER — Other Ambulatory Visit: Payer: Self-pay

## 2024-04-16 ENCOUNTER — Telehealth: Payer: Self-pay | Admitting: Nurse Practitioner

## 2024-04-16 DIAGNOSIS — C220 Liver cell carcinoma: Secondary | ICD-10-CM

## 2024-04-16 NOTE — Telephone Encounter (Signed)
 I received a staff message that Shane Ochoa wanted to re-schedule his appointment originally scheduled for 10/1. When I called he  was not available, I LVM informing him that he is now  scheduled for 10/3 and if he needs to re-schedule to please return my call.

## 2024-04-17 ENCOUNTER — Inpatient Hospital Stay: Admitting: Nurse Practitioner

## 2024-04-17 ENCOUNTER — Inpatient Hospital Stay

## 2024-04-18 ENCOUNTER — Other Ambulatory Visit: Payer: Self-pay | Admitting: Nurse Practitioner

## 2024-04-18 DIAGNOSIS — C220 Liver cell carcinoma: Secondary | ICD-10-CM

## 2024-04-18 NOTE — Progress Notes (Signed)
 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   -Found to have screening detected 3.1 cm LI-RADS 5 liver lesion with elevated AFP (13) which is consistent with HCC.  We are not recommending a biopsy.  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  -He is more interested in local therapy that would not take him long to recover.  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 appears well with preserved liver function and very good performance status for his age.  He is asymptomatic -We discussed other treatment options including ablation, Y90, or external radiation/SBRT if staging CT is negative.  Seems to be leaning more towards radiation at this time but will discuss with his daughters -Will review his case in tumor board next week and call him with recommendations and refer him to specialists as needed -If he proceeds with IR guided therapy and they follow, we will see him as needed in the future; otherwise we will see him back for observation after treatment. - Patient did not proceed with any treatment after initial diagnosis.  His primary care ordered a follow-up MRI of the abdomen which was done 03/20/2024.  This showed primary liver lesion nearly doubling in size over the past year.  There are also numerous, smaller,  bilobar hepatic lesions throughout the liver, consistent with hepatocellular carcinoma.  AFP very elevated at 710.0 is agreeable to CT of the chest to evaluate for metastatic disease.  He states he will decline any offered treatment as he currently feels fine and states he has limited a long and happy life.  Will discuss results of CT chest with patient and his daughter once they are available.   Hepatocellular carcinoma  Recent MRI of the abdomen, showed increasing size of primary hepatic lesion. It has nearly doubled in size since most recent scan, last year. He also has multiple smaller bilateral bladder hepatic lesions, compatible with hepatocellular carcinoma.  Recommend CT scan of the chest to evaluate for metastatic disease.  His daughter, present during today's visit, encouraged him to have the scan.He has reluctantly agreed to have the scan.  He is emphatic about not wishing to pursue treatment HCC.  Order for CT chest was done as part of today's visit.  Will discuss results with patient and his daughter once they are available.  Plan Labs reviewed. - Unremarkable CBC. - Mild elevation of AST and ALT. - AFP 710.0. Reviewed results of abdominal MRI showing significant increase in size of primary hepatic lesion along with numerous, smaller, bilobar hepatic lesions, consistent with HCC. Patient is agreeable to to evaluate for distant metastases. He is emphatic about not pursuing treatment for [facility] as he feels good. CT chest with contrast. Will follow-up with patient to review results once they are available.   The patient understands the plans discussed today and is in agreement  with them.  He knows to contact our office if he develops concerns prior to his next appointment.  I provided 30 minutes of face-to-face time during this encounter and > 50% was spent counseling as documented under my assessment and plan.    [name], NP  [facility] - A DEPT OF [place] [address] [place] [address] Dept: [phone] Dept Fax: [phone]   Orders Placed This Encounter  Procedures   CT Chest W Contrast    MEDICARE PART A AND B-bcbs Wt 177/No Needs /nt diabetic/nkda to iv dye or contrast/NO HBP/no kid dz or liver dz /No spinal cord/No body injector/no glucose mon/no heart monitor/NO PORT ab w/pt daughter(cheryl) Please remember if you need to cancel your appt, please do so 24 hours prior to your appointment to avoid getting charged a no-show fee of $75.00 pt is aware/pt verbal understood instructions given    Standing Status:   Future    Number of Occurrences:   1    Expected Date:   04/26/2024    Expiration Date:   04/19/2025    If indicated for the ordered procedure, I authorize the administration of contrast media per Radiology protocol:   Yes    Does the patient have a contrast media/X-ray dye allergy?:   No    Preferred imaging location?:   [facility]      CHIEF COMPLAINT:  CC: Hepatocellular carcinoma  Current Treatment:  surveillance (patient has declined treatment)  INTERVAL HISTORY:  Shane Ochoa is here today for repeat clinical assessment.  He last saw Lacie, NP, on 03/02/2023.  The patient recently had MRI abdomen per his priimary care provider.  There was increased size of inferior right hepatic lobe lesion which now measures 6.7 cm in diameter (was 3.1 x 3.1 cm previously).  There are multiple bilobar hepatic lesions favoring diagnosis of hepatocellular carcinoma.  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  This scan was performed after the patient was seen in the medical 3 weeks ago after he had a fall at home.  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  As a result, he has frequent falls.  The fall 3 weeks ago was onto the cement.  He landed on his right shoulder and elbow.  Pain is gradually resolving.  He states that otherwise, he feels great.  He denies chest pain,  chest pressure, or shortness of breath. He denies headaches or visual disturbances. He denies abdominal pain, nausea, vomiting, or changes in bowel or bladder habits.  He denies fevers or chills. He denies pain. His appetite is good. His weight has decreased 3 pounds over last 6 weeks.  I have reviewed the past medical history, past surgical history, social history and family history with the patient and they are unchanged from previous note.  ALLERGIES:  is allergic to antihistamines, chlorpheniramine-type.  MEDICATIONS:  Current Outpatient Medications  Medication Sig Dispense Refill   aspirin 81 MG tablet Take 81 mg by mouth daily.     cephALEXin  (KEFLEX ) 500 MG capsule Take 1 capsule (500 mg total) by mouth 4 (four) times daily. 20 capsule 0   escitalopram (LEXAPRO) 20 MG tablet Take 20 mg by mouth daily.     losartan -hydrochlorothiazide  (HYZAAR) 100-25 MG per tablet Take 1 tablet by mouth daily.     Multiple Vitamins-Minerals (PRESERVISION AREDS 2+MULTI VIT PO) Take by mouth daily. 1 Tablet Daily  pantoprazole (PROTONIX) 40 MG tablet Take 40 mg by mouth daily.     Tamsulosin HCl (FLOMAX) 0.4 MG CAPS Take 0.4 mg by mouth daily.     zolpidem (AMBIEN) 10 MG tablet Take 5 mg by mouth at bedtime as needed. For sleep     No current facility-administered medications for this visit.    HISTORY OF PRESENT ILLNESS:   Oncology History  Hepatocellular carcinoma (HCC)  12/27/2022 Imaging   ABD US  IMPRESSION: 1. There is a 4.4 cm hypoechoic mass within the right hepatic lobe. Recommend further evaluation with pre and post contrast-enhanced abdominal MRI. 2. Cholelithiasis without sonographic evidence for acute cholecystitis. 3. Increased hepatic parenchymal echogenicity suggestive of steatosis.      01/26/2023 Imaging   MRI IMPRESSION: 1. In the inferior tip of the right lobe of the liver, hepatic segment VI, there is a subcapsular mass measuring 3.1 x 3.1 cm, with heterogeneous  arterial hyperenhancement and clear evidence of both capsular enhancement or washout on later contrast phases. This is consistent with hepatocellular carcinoma, LI-RADS category 5. 2. No evidence of lymphadenopathy or metastatic disease in the abdomen. 3. Hepatic steatosis. Mildly coarse contour of the liver, consistent with cirrhosis. No ascites. 4. Cholelithiasis.   02/07/2023 Tumor Marker   AFP: 13   03/02/2023 Initial Diagnosis   Hepatocellular carcinoma (HCC)   03/02/2023 Cancer Staging   Staging form: Liver, AJCC 8th Edition - Clinical: cT1b, cN0 - Signed by Burton, Lacie K, NP on 03/02/2023       REVIEW OF SYSTEMS:   Constitutional: Denies fevers, chills or abnormal weight loss Eyes: Denies blurriness of vision Ears, nose, mouth, throat, and face: Denies mucositis or sore throat Respiratory: Denies cough, dyspnea or wheezes Cardiovascular: Denies palpitation, chest discomfort or lower extremity swelling Gastrointestinal:  Denies nausea, heartburn or change in bowel habits Skin: Denies abnormal skin rashes Lymphatics: Denies new lymphadenopathy or easy bruising Neurological:Denies numbness, tingling or new weaknesses Behavioral/Psych: Mood is stable, no new changes  All other systems were reviewed with the patient and are negative.   VITALS:   Today's Vitals   04/19/24 1423  BP: 136/82  Pulse: 84  Resp: 17  Temp: 97.7 F (36.5 C)  SpO2: 96%  Weight: 177 lb 11.2 oz (80.6 kg)  Height: 5' 7 (1.702 m)   Body mass index is 27.83 kg/m.   Wt Readings from Last 3 Encounters:  04/19/24 177 lb 11.2 oz (80.6 kg)  03/31/24 165 lb (74.8 kg)  03/06/23 180 lb (81.6 kg)    Body mass index is 27.83 kg/m.  Performance status (ECOG): 1 - Symptomatic but completely ambulatory  PHYSICAL EXAM:   GENERAL:alert, no distress and comfortable SKIN: skin color, texture, turgor are normal, no rashes or significant lesions EYES: normal, Conjunctiva are pink and non-injected,  sclera clear OROPHARYNX:no exudate, no erythema and lips, buccal mucosa, and tongue normal  NECK: supple, thyroid normal size, non-tender, without nodularity LYMPH:  no palpable lymphadenopathy in the cervical, axillary or inguinal LUNGS: clear to auscultation and percussion with normal breathing effort HEART: regular rate & rhythm and no murmurs and no lower extremity edema ABDOMEN:abdomen soft, non-tender and normal bowel sounds Musculoskeletal:no cyanosis of digits and no clubbing  NEURO: alert & oriented x 3 with fluent speech, no focal motor/sensory deficits  LABORATORY DATA:  I have reviewed the data as listed    Component Value Date/Time   NA 139 04/19/2024 1332   K 3.8 04/19/2024 1332   CL 104 04/19/2024 1332  CO2 29 04/19/2024 1332   GLUCOSE 162 (H) 04/19/2024 1332   BUN 13 04/19/2024 1332   CREATININE 0.92 04/19/2024 1332   CALCIUM 9.0 04/19/2024 1332   PROT 6.3 (L) 04/19/2024 1332   ALBUMIN 3.8 04/19/2024 1332   AST 58 (H) 04/19/2024 1332   ALT 66 (H) 04/19/2024 1332   ALKPHOS 82 04/19/2024 1332   BILITOT 0.7 04/19/2024 1332   GFRNONAA >60 04/19/2024 1332   GFRAA >90 10/05/2011 1708     Lab Results  Component Value Date   WBC 2.6 (L) 04/19/2024   NEUTROABS 1.9 04/19/2024   HGB 13.4 04/19/2024   HCT 38.6 (L) 04/19/2024   MCV 93.0 04/19/2024   PLT 87 (L) 04/19/2024     RADIOGRAPHIC STUDIES: CT Chest W Contrast Result Date: 05/03/2024 CLINICAL DATA:  Hepatocellular carcinoma. Staging. * Tracking Code: BO * EXAM: CT CHEST WITH CONTRAST TECHNIQUE: Multidetector CT imaging of the chest was performed during intravenous contrast administration. RADIATION DOSE REDUCTION: This exam was performed according to the departmental dose-optimization program which includes automated exposure control, adjustment of the mA and/or kV according to patient size and/or use of iterative reconstruction technique. CONTRAST:  75mL ISOVUE-300 IOPAMIDOL (ISOVUE-300) INJECTION 61%  COMPARISON:  03/14/2023 FINDINGS: Cardiovascular: The heart size is normal. No substantial pericardial effusion. Coronary artery calcification is evident. Mild atherosclerotic calcification is noted in the wall of the thoracic aorta. Mediastinum/Nodes: No mediastinal lymphadenopathy. There is no hilar lymphadenopathy. The esophagus has normal imaging features. There is no axillary lymphadenopathy. Lungs/Pleura: No suspicious pulmonary nodule or mass. No focal airspace consolidation. There is no evidence of pleural effusion. Upper Abdomen: Multiple enhancing lesions are seen scattered in both hepatic lobes, better characterized on MRI 03/20/2024. Nodular liver contour evident. No ascites. Musculoskeletal: No worrisome lytic or sclerotic osseous abnormality. IMPRESSION: 1. No evidence for metastatic disease in the chest. 2. Multiple enhancing lesions scattered in both hepatic lobes, better characterized on MRI 03/20/2024. 3.  Aortic Atherosclerosis (ICD10-I70.0). Electronically Signed   By: Camellia Candle M.D.   On: 05/03/2024 09:16

## 2024-04-18 NOTE — Assessment & Plan Note (Addendum)
 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   -Found to have screening detected 3.1 cm LI-RADS 5 liver lesion with elevated AFP (13) which is consistent with HCC.  We are not recommending a biopsy.  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  -He is more interested in local therapy that would not take him long to recover.  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 appears well with preserved liver function and very good performance status for his age.  He is asymptomatic -We discussed other treatment options including ablation, Y90, or external radiation/SBRT if staging CT is negative.  Seems to be leaning more towards radiation at this time but will discuss with his daughters -Will review his case in tumor board next week and call him with recommendations and refer him to specialists as needed -If he proceeds with IR guided therapy and they follow, we will see him as needed in the future; otherwise we will see him back for observation after treatment. - Patient did not proceed with any treatment after initial diagnosis.  His primary care ordered a follow-up MRI of the abdomen which was done 03/20/2024.  This showed primary liver lesion nearly doubling in size over the past year.  There are also numerous, smaller, bilobar hepatic lesions throughout the liver, consistent with hepatocellular carcinoma.  AFP very elevated at 710.0 is agreeable to CT of the chest to evaluate for metastatic disease.  He states he will decline any offered treatment as he currently feels fine and states he  has limited a long and happy life.  Will discuss results of CT chest with patient and his daughter once they are available.

## 2024-04-19 ENCOUNTER — Inpatient Hospital Stay: Admitting: Nurse Practitioner

## 2024-04-19 ENCOUNTER — Inpatient Hospital Stay: Attending: Internal Medicine

## 2024-04-19 VITALS — BP 136/82 | HR 84 | Temp 97.7°F | Resp 17 | Ht 67.0 in | Wt 177.7 lb

## 2024-04-19 DIAGNOSIS — C220 Liver cell carcinoma: Secondary | ICD-10-CM | POA: Insufficient documentation

## 2024-04-19 DIAGNOSIS — R296 Repeated falls: Secondary | ICD-10-CM | POA: Insufficient documentation

## 2024-04-19 DIAGNOSIS — F1090 Alcohol use, unspecified, uncomplicated: Secondary | ICD-10-CM | POA: Insufficient documentation

## 2024-04-19 DIAGNOSIS — Z79899 Other long term (current) drug therapy: Secondary | ICD-10-CM | POA: Diagnosis not present

## 2024-04-19 LAB — CBC WITH DIFFERENTIAL (CANCER CENTER ONLY)
Abs Immature Granulocytes: 0.01 K/uL (ref 0.00–0.07)
Basophils Absolute: 0 K/uL (ref 0.0–0.1)
Basophils Relative: 0 %
Eosinophils Absolute: 0 K/uL (ref 0.0–0.5)
Eosinophils Relative: 2 %
HCT: 38.6 % — ABNORMAL LOW (ref 39.0–52.0)
Hemoglobin: 13.4 g/dL (ref 13.0–17.0)
Immature Granulocytes: 0 %
Lymphocytes Relative: 16 %
Lymphs Abs: 0.4 K/uL — ABNORMAL LOW (ref 0.7–4.0)
MCH: 32.3 pg (ref 26.0–34.0)
MCHC: 34.7 g/dL (ref 30.0–36.0)
MCV: 93 fL (ref 80.0–100.0)
Monocytes Absolute: 0.2 K/uL (ref 0.1–1.0)
Monocytes Relative: 9 %
Neutro Abs: 1.9 K/uL (ref 1.7–7.7)
Neutrophils Relative %: 73 %
Platelet Count: 87 K/uL — ABNORMAL LOW (ref 150–400)
RBC: 4.15 MIL/uL — ABNORMAL LOW (ref 4.22–5.81)
RDW: 13.5 % (ref 11.5–15.5)
WBC Count: 2.6 K/uL — ABNORMAL LOW (ref 4.0–10.5)
nRBC: 0 % (ref 0.0–0.2)

## 2024-04-19 LAB — CMP (CANCER CENTER ONLY)
ALT: 66 U/L — ABNORMAL HIGH (ref 0–44)
AST: 58 U/L — ABNORMAL HIGH (ref 15–41)
Albumin: 3.8 g/dL (ref 3.5–5.0)
Alkaline Phosphatase: 82 U/L (ref 38–126)
Anion gap: 6 (ref 5–15)
BUN: 13 mg/dL (ref 8–23)
CO2: 29 mmol/L (ref 22–32)
Calcium: 9 mg/dL (ref 8.9–10.3)
Chloride: 104 mmol/L (ref 98–111)
Creatinine: 0.92 mg/dL (ref 0.61–1.24)
GFR, Estimated: >60 mL/min (ref >60)
Glucose, Bld: 162 mg/dL — ABNORMAL HIGH (ref 70–99)
Potassium: 3.8 mmol/L (ref 3.5–5.1)
Sodium: 139 mmol/L (ref 135–145)
Total Bilirubin: 0.7 mg/dL (ref 0.0–1.2)
Total Protein: 6.3 g/dL — ABNORMAL LOW (ref 6.5–8.1)

## 2024-04-20 LAB — AFP TUMOR MARKER: AFP, Serum, Tumor Marker: 710 ng/mL — ABNORMAL HIGH (ref 0.0–6.4)

## 2024-05-02 ENCOUNTER — Ambulatory Visit
Admission: RE | Admit: 2024-05-02 | Discharge: 2024-05-02 | Disposition: A | Discharge status: Home / Self Care | Source: Ambulatory Visit | Attending: Nurse Practitioner | Admitting: Nurse Practitioner

## 2024-05-02 DIAGNOSIS — C220 Liver cell carcinoma: Secondary | ICD-10-CM

## 2024-05-02 MED ORDER — IOPAMIDOL (ISOVUE-300) INJECTION 61%
75.0000 mL | Freq: Once | INTRAVENOUS | Status: AC | PRN
  Administered 2024-05-02: 75 mL via INTRAVENOUS

## 2024-05-05 ENCOUNTER — Encounter: Payer: Self-pay | Admitting: Nurse Practitioner

## 2024-05-06 ENCOUNTER — Encounter: Payer: Self-pay | Admitting: Interventional Radiology

## 2024-05-06 DIAGNOSIS — C220 Liver cell carcinoma: Secondary | ICD-10-CM

## 2024-05-09 DIAGNOSIS — H353124 Nonexudative age-related macular degeneration, left eye, advanced atrophic with subfoveal involvement: Secondary | ICD-10-CM | POA: Diagnosis not present

## 2024-05-09 DIAGNOSIS — H353113 Nonexudative age-related macular degeneration, right eye, advanced atrophic without subfoveal involvement: Secondary | ICD-10-CM | POA: Diagnosis not present

## 2024-05-09 DIAGNOSIS — H04123 Dry eye syndrome of bilateral lacrimal glands: Secondary | ICD-10-CM | POA: Diagnosis not present

## 2024-05-09 DIAGNOSIS — H35371 Puckering of macula, right eye: Secondary | ICD-10-CM | POA: Diagnosis not present

## 2024-05-09 DIAGNOSIS — H43813 Vitreous degeneration, bilateral: Secondary | ICD-10-CM | POA: Diagnosis not present

## 2024-05-16 DIAGNOSIS — E663 Overweight: Secondary | ICD-10-CM | POA: Diagnosis not present

## 2024-05-16 DIAGNOSIS — I10 Essential (primary) hypertension: Secondary | ICD-10-CM | POA: Diagnosis not present

## 2024-05-16 DIAGNOSIS — K219 Gastro-esophageal reflux disease without esophagitis: Secondary | ICD-10-CM | POA: Diagnosis not present

## 2024-05-16 DIAGNOSIS — N400 Enlarged prostate without lower urinary tract symptoms: Secondary | ICD-10-CM | POA: Diagnosis not present

## 2024-05-16 DIAGNOSIS — F419 Anxiety disorder, unspecified: Secondary | ICD-10-CM | POA: Diagnosis not present

## 2024-05-16 DIAGNOSIS — Z7689 Persons encountering health services in other specified circumstances: Secondary | ICD-10-CM | POA: Diagnosis not present

## 2024-05-16 DIAGNOSIS — Z6826 Body mass index (BMI) 26.0-26.9, adult: Secondary | ICD-10-CM | POA: Diagnosis not present

## 2024-05-16 DIAGNOSIS — F1011 Alcohol abuse, in remission: Secondary | ICD-10-CM | POA: Diagnosis not present

## 2024-05-16 DIAGNOSIS — M25511 Pain in right shoulder: Secondary | ICD-10-CM | POA: Diagnosis not present

## 2024-05-24 DIAGNOSIS — M25511 Pain in right shoulder: Secondary | ICD-10-CM | POA: Diagnosis not present

## 2024-06-12 DIAGNOSIS — R7301 Impaired fasting glucose: Secondary | ICD-10-CM | POA: Diagnosis not present

## 2024-06-12 DIAGNOSIS — E663 Overweight: Secondary | ICD-10-CM | POA: Diagnosis not present

## 2024-06-12 DIAGNOSIS — I10 Essential (primary) hypertension: Secondary | ICD-10-CM | POA: Diagnosis not present

## 2024-06-12 DIAGNOSIS — Z6826 Body mass index (BMI) 26.0-26.9, adult: Secondary | ICD-10-CM | POA: Diagnosis not present

## 2024-06-19 DIAGNOSIS — I10 Essential (primary) hypertension: Secondary | ICD-10-CM | POA: Diagnosis not present

## 2024-06-19 DIAGNOSIS — M25511 Pain in right shoulder: Secondary | ICD-10-CM | POA: Diagnosis not present

## 2024-06-19 DIAGNOSIS — R748 Abnormal levels of other serum enzymes: Secondary | ICD-10-CM | POA: Diagnosis not present

## 2024-06-19 DIAGNOSIS — Z6826 Body mass index (BMI) 26.0-26.9, adult: Secondary | ICD-10-CM | POA: Diagnosis not present

## 2024-06-19 DIAGNOSIS — D696 Thrombocytopenia, unspecified: Secondary | ICD-10-CM | POA: Diagnosis not present

## 2024-06-19 DIAGNOSIS — C220 Liver cell carcinoma: Secondary | ICD-10-CM | POA: Diagnosis not present

## 2024-06-19 DIAGNOSIS — N400 Enlarged prostate without lower urinary tract symptoms: Secondary | ICD-10-CM | POA: Diagnosis not present

## 2024-06-19 DIAGNOSIS — F1011 Alcohol abuse, in remission: Secondary | ICD-10-CM | POA: Diagnosis not present

## 2024-06-19 DIAGNOSIS — K219 Gastro-esophageal reflux disease without esophagitis: Secondary | ICD-10-CM | POA: Diagnosis not present

## 2024-06-19 DIAGNOSIS — F419 Anxiety disorder, unspecified: Secondary | ICD-10-CM | POA: Diagnosis not present

## 2024-06-19 DIAGNOSIS — E663 Overweight: Secondary | ICD-10-CM | POA: Diagnosis not present

## 2024-06-26 DIAGNOSIS — H353124 Nonexudative age-related macular degeneration, left eye, advanced atrophic with subfoveal involvement: Secondary | ICD-10-CM | POA: Diagnosis not present

## 2024-06-26 DIAGNOSIS — H353113 Nonexudative age-related macular degeneration, right eye, advanced atrophic without subfoveal involvement: Secondary | ICD-10-CM | POA: Diagnosis not present

## 2024-06-26 DIAGNOSIS — H43813 Vitreous degeneration, bilateral: Secondary | ICD-10-CM | POA: Diagnosis not present

## 2024-06-26 DIAGNOSIS — H35371 Puckering of macula, right eye: Secondary | ICD-10-CM | POA: Diagnosis not present

## 2024-06-26 DIAGNOSIS — H04123 Dry eye syndrome of bilateral lacrimal glands: Secondary | ICD-10-CM | POA: Diagnosis not present
